# Patient Record
Sex: Female | Born: 1955 | Race: White | Hispanic: No | Marital: Married | Smoking: Never smoker
Health system: Southern US, Community
[De-identification: ages and names within clinical notes are randomized; demographics above are authoritative.]

## PROBLEM LIST (undated history)

## (undated) DIAGNOSIS — Z8489 Family history of other specified conditions: Secondary | ICD-10-CM

## (undated) DIAGNOSIS — G4700 Insomnia, unspecified: Secondary | ICD-10-CM

## (undated) DIAGNOSIS — K219 Gastro-esophageal reflux disease without esophagitis: Secondary | ICD-10-CM

## (undated) DIAGNOSIS — R112 Nausea with vomiting, unspecified: Secondary | ICD-10-CM

## (undated) DIAGNOSIS — N200 Calculus of kidney: Secondary | ICD-10-CM

## (undated) DIAGNOSIS — M8580 Other specified disorders of bone density and structure, unspecified site: Secondary | ICD-10-CM

## (undated) DIAGNOSIS — R002 Palpitations: Secondary | ICD-10-CM

## (undated) DIAGNOSIS — Z9889 Other specified postprocedural states: Secondary | ICD-10-CM

## (undated) DIAGNOSIS — M779 Enthesopathy, unspecified: Secondary | ICD-10-CM

## (undated) DIAGNOSIS — E785 Hyperlipidemia, unspecified: Secondary | ICD-10-CM

## (undated) DIAGNOSIS — M1990 Unspecified osteoarthritis, unspecified site: Secondary | ICD-10-CM

## (undated) DIAGNOSIS — N952 Postmenopausal atrophic vaginitis: Secondary | ICD-10-CM

## (undated) DIAGNOSIS — R519 Headache, unspecified: Secondary | ICD-10-CM

## (undated) DIAGNOSIS — R51 Headache: Secondary | ICD-10-CM

## (undated) DIAGNOSIS — Z9189 Other specified personal risk factors, not elsewhere classified: Secondary | ICD-10-CM

## (undated) DIAGNOSIS — Z91B Personal risk factor of exposure to diethylstilbestrol: Secondary | ICD-10-CM

## (undated) DIAGNOSIS — Z87442 Personal history of urinary calculi: Secondary | ICD-10-CM

## (undated) HISTORY — DX: Hyperlipidemia, unspecified: E78.5

## (undated) HISTORY — DX: Gastro-esophageal reflux disease without esophagitis: K21.9

## (undated) HISTORY — PX: ROTATOR CUFF REPAIR: SHX139

## (undated) HISTORY — DX: Calculus of kidney: N20.0

## (undated) HISTORY — DX: Headache, unspecified: R51.9

## (undated) HISTORY — DX: Other specified disorders of bone density and structure, unspecified site: M85.80

## (undated) HISTORY — PX: FOOT SURGERY: SHX648

## (undated) HISTORY — DX: Headache: R51

## (undated) HISTORY — DX: Enthesopathy, unspecified: M77.9

## (undated) HISTORY — DX: Other specified personal risk factors, not elsewhere classified: Z91.89

## (undated) HISTORY — PX: APPENDECTOMY: SHX54

## (undated) HISTORY — DX: Palpitations: R00.2

## (undated) HISTORY — PX: ADENOIDECTOMY: SUR15

## (undated) HISTORY — PX: TONSILLECTOMY: SUR1361

## (undated) HISTORY — DX: Insomnia, unspecified: G47.00

## (undated) HISTORY — PX: ARTHROSCOPIC REPAIR ACL: SUR80

## (undated) HISTORY — DX: Postmenopausal atrophic vaginitis: N95.2

## (undated) HISTORY — DX: Personal risk factor of exposure to diethylstilbestrol: Z91.B

---

## 2004-05-29 ENCOUNTER — Ambulatory Visit: Payer: Self-pay

## 2006-02-25 ENCOUNTER — Ambulatory Visit: Payer: Self-pay

## 2007-05-14 ENCOUNTER — Ambulatory Visit: Payer: Self-pay | Admitting: Internal Medicine

## 2007-05-26 ENCOUNTER — Ambulatory Visit: Payer: Self-pay | Admitting: Internal Medicine

## 2007-07-22 ENCOUNTER — Ambulatory Visit: Payer: Self-pay | Admitting: Gastroenterology

## 2007-08-19 ENCOUNTER — Encounter: Payer: Self-pay | Admitting: Physician Assistant

## 2007-09-07 ENCOUNTER — Encounter: Payer: Self-pay | Admitting: Physician Assistant

## 2008-06-01 ENCOUNTER — Ambulatory Visit: Payer: Self-pay | Admitting: Internal Medicine

## 2009-06-06 ENCOUNTER — Ambulatory Visit: Payer: Self-pay

## 2009-06-13 ENCOUNTER — Ambulatory Visit: Payer: Self-pay

## 2009-07-26 ENCOUNTER — Encounter: Payer: Self-pay | Admitting: Unknown Physician Specialty

## 2009-08-06 ENCOUNTER — Encounter: Payer: Self-pay | Admitting: Unknown Physician Specialty

## 2009-09-06 ENCOUNTER — Encounter: Payer: Self-pay | Admitting: Unknown Physician Specialty

## 2009-10-06 ENCOUNTER — Encounter: Payer: Self-pay | Admitting: Unknown Physician Specialty

## 2009-11-06 ENCOUNTER — Encounter: Payer: Self-pay | Admitting: Unknown Physician Specialty

## 2009-12-07 ENCOUNTER — Encounter: Payer: Self-pay | Admitting: Unknown Physician Specialty

## 2010-05-18 ENCOUNTER — Encounter: Payer: Self-pay | Admitting: Sports Medicine

## 2010-06-07 ENCOUNTER — Encounter: Payer: Self-pay | Admitting: Sports Medicine

## 2010-07-08 ENCOUNTER — Encounter: Payer: Self-pay | Admitting: Sports Medicine

## 2010-07-24 ENCOUNTER — Ambulatory Visit: Payer: Self-pay | Admitting: Unknown Physician Specialty

## 2010-08-07 ENCOUNTER — Encounter: Payer: Self-pay | Admitting: Sports Medicine

## 2010-08-27 ENCOUNTER — Ambulatory Visit: Payer: Self-pay | Admitting: Unknown Physician Specialty

## 2011-04-09 LAB — HM COLONOSCOPY

## 2011-07-11 ENCOUNTER — Ambulatory Visit: Payer: Self-pay | Admitting: Podiatry

## 2011-08-19 ENCOUNTER — Ambulatory Visit: Payer: Self-pay | Admitting: Internal Medicine

## 2012-03-19 ENCOUNTER — Ambulatory Visit: Payer: Self-pay | Admitting: Podiatry

## 2012-10-22 ENCOUNTER — Ambulatory Visit: Payer: Self-pay | Admitting: Podiatry

## 2013-01-21 ENCOUNTER — Ambulatory Visit: Payer: Self-pay | Admitting: Obstetrics and Gynecology

## 2013-06-27 ENCOUNTER — Emergency Department: Payer: Self-pay | Admitting: Emergency Medicine

## 2013-09-10 ENCOUNTER — Ambulatory Visit: Payer: Self-pay | Admitting: Internal Medicine

## 2014-02-22 DIAGNOSIS — M7542 Impingement syndrome of left shoulder: Secondary | ICD-10-CM | POA: Insufficient documentation

## 2014-02-22 DIAGNOSIS — M7540 Impingement syndrome of unspecified shoulder: Secondary | ICD-10-CM | POA: Insufficient documentation

## 2014-03-01 DIAGNOSIS — M542 Cervicalgia: Secondary | ICD-10-CM

## 2014-03-01 DIAGNOSIS — G8929 Other chronic pain: Secondary | ICD-10-CM | POA: Insufficient documentation

## 2014-03-01 DIAGNOSIS — M25519 Pain in unspecified shoulder: Secondary | ICD-10-CM | POA: Insufficient documentation

## 2014-03-02 ENCOUNTER — Ambulatory Visit: Payer: Self-pay | Admitting: Obstetrics and Gynecology

## 2014-03-04 ENCOUNTER — Ambulatory Visit: Payer: Self-pay | Admitting: Unknown Physician Specialty

## 2014-03-07 LAB — HM MAMMOGRAPHY

## 2014-03-25 ENCOUNTER — Ambulatory Visit: Payer: Self-pay | Admitting: Unknown Physician Specialty

## 2014-04-05 DIAGNOSIS — M75122 Complete rotator cuff tear or rupture of left shoulder, not specified as traumatic: Secondary | ICD-10-CM | POA: Insufficient documentation

## 2014-07-12 ENCOUNTER — Encounter: Payer: Self-pay | Admitting: *Deleted

## 2014-11-03 DIAGNOSIS — M25551 Pain in right hip: Secondary | ICD-10-CM | POA: Insufficient documentation

## 2014-11-03 DIAGNOSIS — M7070 Other bursitis of hip, unspecified hip: Secondary | ICD-10-CM | POA: Insufficient documentation

## 2015-01-05 DIAGNOSIS — M5416 Radiculopathy, lumbar region: Secondary | ICD-10-CM | POA: Insufficient documentation

## 2015-01-06 ENCOUNTER — Other Ambulatory Visit: Payer: Self-pay | Admitting: Unknown Physician Specialty

## 2015-01-06 DIAGNOSIS — M25551 Pain in right hip: Secondary | ICD-10-CM

## 2015-01-12 LAB — HM PAP SMEAR: HM Pap smear: NEGATIVE

## 2015-01-16 ENCOUNTER — Ambulatory Visit

## 2015-01-16 ENCOUNTER — Ambulatory Visit
Admission: RE | Admit: 2015-01-16 | Discharge: 2015-01-16 | Disposition: A | Discharge status: Home / Self Care | Source: Ambulatory Visit | Attending: Unknown Physician Specialty | Admitting: Unknown Physician Specialty

## 2015-01-16 DIAGNOSIS — M25551 Pain in right hip: Secondary | ICD-10-CM | POA: Diagnosis present

## 2015-01-16 DIAGNOSIS — M47816 Spondylosis without myelopathy or radiculopathy, lumbar region: Secondary | ICD-10-CM | POA: Diagnosis not present

## 2015-01-16 DIAGNOSIS — M4806 Spinal stenosis, lumbar region: Secondary | ICD-10-CM | POA: Diagnosis not present

## 2015-01-17 ENCOUNTER — Encounter: Payer: Self-pay | Admitting: Obstetrics and Gynecology

## 2015-06-09 ENCOUNTER — Other Ambulatory Visit: Payer: Self-pay | Admitting: Obstetrics and Gynecology

## 2015-06-09 DIAGNOSIS — Z1231 Encounter for screening mammogram for malignant neoplasm of breast: Secondary | ICD-10-CM

## 2015-06-13 ENCOUNTER — Ambulatory Visit
Admission: RE | Admit: 2015-06-13 | Discharge: 2015-06-13 | Disposition: A | Discharge status: Home / Self Care | Source: Ambulatory Visit | Attending: Obstetrics and Gynecology | Admitting: Obstetrics and Gynecology

## 2015-06-13 DIAGNOSIS — Z1231 Encounter for screening mammogram for malignant neoplasm of breast: Secondary | ICD-10-CM

## 2016-07-16 DIAGNOSIS — M1712 Unilateral primary osteoarthritis, left knee: Secondary | ICD-10-CM | POA: Insufficient documentation

## 2016-07-19 ENCOUNTER — Other Ambulatory Visit (HOSPITAL_COMMUNITY): Payer: Self-pay | Admitting: Obstetrics and Gynecology

## 2016-07-19 ENCOUNTER — Other Ambulatory Visit: Payer: Self-pay | Admitting: Obstetrics and Gynecology

## 2016-07-19 DIAGNOSIS — Z1231 Encounter for screening mammogram for malignant neoplasm of breast: Secondary | ICD-10-CM

## 2016-07-29 ENCOUNTER — Telehealth: Payer: Self-pay

## 2016-07-29 NOTE — Telephone Encounter (Signed)
Left message on pts voice mail to contact office to schedule colonoscopy.

## 2016-08-02 ENCOUNTER — Telehealth: Payer: Self-pay

## 2016-08-02 ENCOUNTER — Other Ambulatory Visit: Payer: Self-pay

## 2016-08-02 DIAGNOSIS — K219 Gastro-esophageal reflux disease without esophagitis: Secondary | ICD-10-CM | POA: Insufficient documentation

## 2016-08-02 DIAGNOSIS — E7800 Pure hypercholesterolemia, unspecified: Secondary | ICD-10-CM | POA: Insufficient documentation

## 2016-08-02 DIAGNOSIS — Z860100 Personal history of colon polyps, unspecified: Secondary | ICD-10-CM

## 2016-08-02 DIAGNOSIS — M8580 Other specified disorders of bone density and structure, unspecified site: Secondary | ICD-10-CM | POA: Insufficient documentation

## 2016-08-02 DIAGNOSIS — M159 Polyosteoarthritis, unspecified: Secondary | ICD-10-CM | POA: Insufficient documentation

## 2016-08-02 NOTE — Telephone Encounter (Signed)
Gastroenterology Pre-Procedure Review  Request Date: 6/12 Requesting Physician: Dr. Allen Norris  PATIENT REVIEW QUESTIONS: The patient responded to the following health history questions as indicated:    1. Are you having any GI issues? no 2. Do you have a personal history of Polyps? yes (removed) 3. Do you have a family history of Colon Cancer or Polyps? no 4. Diabetes Mellitus? no 5. Joint replacements in the past 12 months?no 6. Major health problems in the past 3 months?no 7. Any artificial heart valves, MVP, or defibrillator?no    MEDICATIONS & ALLERGIES:    Patient reports the following regarding taking any anticoagulation/antiplatelet therapy:   Plavix, Coumadin, Eliquis, Xarelto, Lovenox, Pradaxa, Brilinta, or Effient? no Aspirin? no  Patient confirms/reports the following medications:  Current Outpatient Prescriptions  Medication Sig Dispense Refill  . butalbital-aspirin-caffeine (FIORINAL) 50-325-40 MG capsule Take by mouth.    Marland Kitchen omeprazole (PRILOSEC) 10 MG capsule Take by mouth.    . raloxifene (EVISTA) 60 MG tablet Take by mouth.    . simvastatin (ZOCOR) 20 MG tablet Take by mouth.     No current facility-administered medications for this visit.     Patient confirms/reports the following allergies:  Allergies  Allergen Reactions  . Morphine Nausea Only  . Other Anaphylaxis    IVP contrast dye  . Penicillin G Anaphylaxis  . Codeine Nausea Only  . Shellfish Allergy Swelling    No orders of the defined types were placed in this encounter.   AUTHORIZATION INFORMATION Primary Insurance: 1D#: Group #:  Secondary Insurance: 1D#: Group #:  SCHEDULE INFORMATION: Date: 6/12 Time: Location: [facility]

## 2016-08-06 ENCOUNTER — Telehealth: Payer: Self-pay | Admitting: Gastroenterology

## 2016-08-06 NOTE — Telephone Encounter (Signed)
08/06/16 Faxed Prior Auth Form to [facility] [employer]

## 2016-08-07 ENCOUNTER — Telehealth: Payer: Self-pay | Admitting: Gastroenterology

## 2016-08-07 NOTE — Telephone Encounter (Signed)
08/08/16 Received fax from [facility]. 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

## 2016-08-20 ENCOUNTER — Ambulatory Visit
Admission: RE | Admit: 2016-08-20 | Discharge: 2016-08-20 | Disposition: A | Discharge status: Home / Self Care | Payer: TRICARE For Life (TFL) | Source: Ambulatory Visit | Attending: Obstetrics and Gynecology | Admitting: Obstetrics and Gynecology

## 2016-08-20 DIAGNOSIS — Z1231 Encounter for screening mammogram for malignant neoplasm of breast: Secondary | ICD-10-CM

## 2016-09-16 ENCOUNTER — Encounter: Payer: Self-pay | Admitting: *Deleted

## 2016-09-17 ENCOUNTER — Encounter
Admission: RE | Disposition: A | Discharge status: Home / Self Care | Payer: Self-pay | Source: Ambulatory Visit | Attending: Gastroenterology

## 2016-09-17 ENCOUNTER — Ambulatory Visit: Admitting: Certified Registered"

## 2016-09-17 ENCOUNTER — Ambulatory Visit
Admission: RE | Admit: 2016-09-17 | Discharge: 2016-09-17 | Disposition: A | Discharge status: Home / Self Care | Source: Ambulatory Visit | Attending: Gastroenterology | Admitting: Gastroenterology

## 2016-09-17 DIAGNOSIS — K219 Gastro-esophageal reflux disease without esophagitis: Secondary | ICD-10-CM | POA: Insufficient documentation

## 2016-09-17 DIAGNOSIS — Z860100 Personal history of colon polyps, unspecified: Secondary | ICD-10-CM

## 2016-09-17 DIAGNOSIS — E785 Hyperlipidemia, unspecified: Secondary | ICD-10-CM | POA: Diagnosis not present

## 2016-09-17 DIAGNOSIS — Z6832 Body mass index (BMI) 32.0-32.9, adult: Secondary | ICD-10-CM | POA: Diagnosis not present

## 2016-09-17 DIAGNOSIS — D123 Benign neoplasm of transverse colon: Secondary | ICD-10-CM

## 2016-09-17 DIAGNOSIS — K640 First degree hemorrhoids: Secondary | ICD-10-CM | POA: Insufficient documentation

## 2016-09-17 DIAGNOSIS — D124 Benign neoplasm of descending colon: Secondary | ICD-10-CM | POA: Insufficient documentation

## 2016-09-17 DIAGNOSIS — Z8249 Family history of ischemic heart disease and other diseases of the circulatory system: Secondary | ICD-10-CM | POA: Insufficient documentation

## 2016-09-17 DIAGNOSIS — Z833 Family history of diabetes mellitus: Secondary | ICD-10-CM | POA: Diagnosis not present

## 2016-09-17 DIAGNOSIS — Z91013 Allergy to seafood: Secondary | ICD-10-CM | POA: Insufficient documentation

## 2016-09-17 DIAGNOSIS — D125 Benign neoplasm of sigmoid colon: Secondary | ICD-10-CM

## 2016-09-17 DIAGNOSIS — Z1211 Encounter for screening for malignant neoplasm of colon: Secondary | ICD-10-CM | POA: Diagnosis present

## 2016-09-17 DIAGNOSIS — D122 Benign neoplasm of ascending colon: Secondary | ICD-10-CM

## 2016-09-17 DIAGNOSIS — D120 Benign neoplasm of cecum: Secondary | ICD-10-CM | POA: Diagnosis not present

## 2016-09-17 DIAGNOSIS — R002 Palpitations: Secondary | ICD-10-CM | POA: Insufficient documentation

## 2016-09-17 DIAGNOSIS — M8580 Other specified disorders of bone density and structure, unspecified site: Secondary | ICD-10-CM | POA: Diagnosis not present

## 2016-09-17 DIAGNOSIS — Z79899 Other long term (current) drug therapy: Secondary | ICD-10-CM | POA: Diagnosis not present

## 2016-09-17 DIAGNOSIS — Z880 Allergy status to penicillin: Secondary | ICD-10-CM | POA: Diagnosis not present

## 2016-09-17 DIAGNOSIS — K635 Polyp of colon: Secondary | ICD-10-CM

## 2016-09-17 DIAGNOSIS — G4700 Insomnia, unspecified: Secondary | ICD-10-CM | POA: Diagnosis not present

## 2016-09-17 DIAGNOSIS — Z888 Allergy status to other drugs, medicaments and biological substances status: Secondary | ICD-10-CM | POA: Diagnosis not present

## 2016-09-17 DIAGNOSIS — Z87442 Personal history of urinary calculi: Secondary | ICD-10-CM | POA: Insufficient documentation

## 2016-09-17 DIAGNOSIS — E669 Obesity, unspecified: Secondary | ICD-10-CM | POA: Insufficient documentation

## 2016-09-17 DIAGNOSIS — Z885 Allergy status to narcotic agent status: Secondary | ICD-10-CM | POA: Diagnosis not present

## 2016-09-17 HISTORY — PX: COLONOSCOPY WITH PROPOFOL: SHX5780

## 2016-09-17 SURGERY — COLONOSCOPY WITH PROPOFOL
Anesthesia: General

## 2016-09-17 MED ORDER — SODIUM CHLORIDE 0.9 % IV SOLN
INTRAVENOUS | Status: DC
  Administered 2016-09-17: 1000 mL via INTRAVENOUS

## 2016-09-17 MED ORDER — PROPOFOL 500 MG/50ML IV EMUL
INTRAVENOUS | Status: DC | PRN
  Administered 2016-09-17: 120 ug/kg/min via INTRAVENOUS

## 2016-09-17 MED ORDER — PROPOFOL 500 MG/50ML IV EMUL
INTRAVENOUS | Status: AC
  Filled 2016-09-17: qty 50

## 2016-09-17 MED ORDER — LIDOCAINE 2% (20 MG/ML) 5 ML SYRINGE
INTRAMUSCULAR | Status: DC | PRN
  Administered 2016-09-17: 25 mg via INTRAVENOUS

## 2016-09-17 MED ORDER — MIDAZOLAM HCL 5 MG/5ML IJ SOLN
INTRAMUSCULAR | Status: DC | PRN
  Administered 2016-09-17: 2 mg via INTRAVENOUS

## 2016-09-17 MED ORDER — MIDAZOLAM HCL 2 MG/2ML IJ SOLN
INTRAMUSCULAR | Status: AC
  Filled 2016-09-17: qty 2

## 2016-09-17 MED ORDER — PROPOFOL 10 MG/ML IV BOLUS
INTRAVENOUS | Status: DC | PRN
  Administered 2016-09-17 (×2): 50 mg via INTRAVENOUS

## 2016-09-17 NOTE — Anesthesia Postprocedure Evaluation (Signed)
Anesthesia Post Note  Patient: Cindy Carpenter  Procedure(s) Performed: Procedure(s) (LRB): COLONOSCOPY WITH PROPOFOL (N/A)  Patient location during evaluation: PACU Anesthesia Type: General Level of consciousness: awake Pain management: pain level controlled Vital Signs Assessment: post-procedure vital signs reviewed and stable Respiratory status: nonlabored ventilation Cardiovascular status: stable Anesthetic complications: no     Last Vitals:  Vitals:   09/17/16 1032 09/17/16 1036  BP: 129/80 135/87  Pulse: 76 71  Resp: 15 18  Temp:      Last Pain:  Vitals:   09/17/16 0904  TempSrc: Tympanic                 VAN STAVEREN,Sharifa Bucholz

## 2016-09-17 NOTE — H&P (Signed)
Cindy Lame, MD [address] [place], [place] [address] Phone:[phone] Fax : [phone]  Primary Care Physician:  Idelle Crouch, MD Primary Gastroenterologist:  Dr. Allen Norris  Pre-Procedure History & Physical: HPI:  Cindy Carpenter is a 61 y.o. female is here for an colonoscopy.   Past Medical History:  Diagnosis Date  . Bone spur   . DES exposure in utero   . GERD (gastroesophageal reflux disease)   . Headache    migraine  . Heart palpitations   . Hyperlipemia   . Insomnia   . Kidney stones   . Osteopenia   . Vaginal atrophy     Past Surgical History:  Procedure Laterality Date  . ADENOIDECTOMY    . APPENDECTOMY    . ARTHROSCOPIC REPAIR ACL    . FOOT SURGERY    . TONSILLECTOMY      Prior to Admission medications   Medication Sig Start Date End Date Taking? Authorizing Provider  butalbital-aspirin-caffeine [facility]) [phone] MG capsule Take by mouth. 01/05/15  Yes [provider]  Calcium Carb-Cholecalciferol (CALCIUM-VITAMIN D) 500-200 MG-UNIT tablet Take by mouth.   Yes [provider]  Cholecalciferol (VITAMIN D-1000 MAX ST) 1000 units tablet Take by mouth.   Yes [provider]  estradiol (ESTRACE) 0.1 MG/GM vaginal cream Place vaginally. 06/08/15  Yes [provider]  Multiple Vitamin (MULTI-VITAMINS) TABS Take by mouth.   Yes [provider]  omeprazole (PRILOSEC) 10 MG capsule Take by mouth.   Yes [provider]  raloxifene (EVISTA) 60 MG tablet Take by mouth. 02/04/14  Yes [provider]  traZODone (DESYREL) 50 MG tablet  03/04/14  Yes [provider]  simvastatin (ZOCOR) 20 MG tablet Take by mouth. 02/04/14   [provider]    Allergies as of 08/02/2016 - never reviewed  Allergen Reaction Noted  . Morphine Nausea Only 01/16/2015  . Other Anaphylaxis 01/16/2015  . Penicillin g Anaphylaxis 01/16/2015  . Codeine Nausea Only 01/16/2015  . Shellfish  allergy Swelling 01/16/2015    Family History  Problem Relation Age of Onset  . Heart disease Mother   . Diabetes Mother   . Heart disease Father   . Diabetes Father   . Hypertension Father   . Diabetes Brother   . Ovarian cancer Maternal Grandmother   . Breast cancer Neg Hx   . Colon cancer Neg Hx     Social History   Social History  . Marital status: Married    Spouse name: N/A  . Number of children: N/A  . Years of education: N/A   Occupational History  . Not on file.   Social History Main Topics  . Smoking status: Never Smoker  . Smokeless tobacco: Not on file  . Alcohol use Yes     Comment: occas  . Drug use: No  . Sexual activity: Yes    Birth control/ protection: Post-menopausal   Other Topics Concern  . Not on file   Social History Narrative  . No narrative on file    Review of Systems: See HPI, otherwise negative ROS  Physical Exam: BP (!) 145/81   Pulse 82   Temp 98.5 F (36.9 C) (Tympanic)   Resp 16   Ht 5\' 2"  (1.575 m)   Wt 178 lb (80.7 kg)   SpO2 100%   BMI 32.56 kg/m  General:   Alert,  pleasant and cooperative in NAD Head:  Normocephalic and atraumatic. Neck:  Supple; no masses or  thyromegaly. Lungs:  Clear throughout to auscultation.    Heart:  Regular rate and rhythm. Abdomen:  Soft, nontender and nondistended. Normal bowel sounds, without guarding, and without rebound.   Neurologic:  Alert and  oriented x4;  grossly normal neurologically.  Impression/Plan: [name] is here for an colonoscopy to be performed for history of polyps  Risks, benefits, limitations, and alternatives regarding  colonoscopy have been reviewed with the patient.  Questions have been answered.  All parties agreeable.   Cindy Lame, MD  09/17/2016, 9:36 AM

## 2016-09-17 NOTE — Op Note (Signed)
[facility] Gastroenterology Patient Name: Cindy Carpenter Procedure Date: 09/17/2016 9:37 AM MRN: [id number] Account #: [id number] Date of Birth: 08-17-1955 Admit Type: Outpatient Age: 61 Room: [facility] ENDO ROOM 4 Gender: Female Note Status: Finalized Procedure:            Colonoscopy Indications:          High risk colon cancer surveillance: Personal history                        of colonic polyps Providers:            Lucilla Lame MD, MD Referring MD:         Leonie Douglas. Doy Hutching, MD (Referring MD) Medicines:            Propofol per Anesthesia Complications:        No immediate complications. Procedure:            Pre-Anesthesia Assessment:                       - Prior to the procedure, a History and Physical was                        performed, and patient medications and allergies were                        reviewed. The patient's tolerance of previous                        anesthesia was also reviewed. The risks and benefits of                        the procedure and the sedation options and risks were                        discussed with the patient. All questions were                        answered, and informed consent was obtained. Prior                        Anticoagulants: The patient has taken no previous                        anticoagulant or antiplatelet agents. ASA Grade                        Assessment: II - A patient with mild systemic disease.                        After reviewing the risks and benefits, the patient was                        deemed in satisfactory condition to undergo the                        procedure.                       After obtaining informed consent, the colonoscope was  passed under direct vision. Throughout the procedure,                        the patient's blood pressure, pulse, and oxygen                        saturations were monitored continuously. The   Colonoscope was introduced through the anus and                        advanced to the the cecum, identified by appendiceal                        orifice and ileocecal valve. The colonoscopy was                        performed without difficulty. The patient tolerated the                        procedure well. The quality of the bowel preparation                        was excellent. Findings:      The perianal and digital rectal examinations were normal.      A 2 mm polyp was found in the cecum. The polyp was sessile. The polyp       was removed with a cold biopsy forceps. Resection and retrieval were       complete.      A 3 mm polyp was found in the ascending colon. The polyp was sessile.       The polyp was removed with a cold biopsy forceps. Resection and       retrieval were complete.      Three sessile polyps were found in the transverse colon. The polyps were       3 to 4 mm in size. These polyps were removed with a cold biopsy forceps.       Resection and retrieval were complete.      Two sessile polyps were found in the descending colon. The polyps were 3       to 4 mm in size. These polyps were removed with a cold biopsy forceps.       Resection and retrieval were complete.      A 3 mm polyp was found in the sigmoid colon. The polyp was sessile. The       polyp was removed with a cold biopsy forceps. Resection and retrieval       were complete.      Non-bleeding internal hemorrhoids were found during retroflexion. The       hemorrhoids were Grade I (internal hemorrhoids that do not prolapse). Impression:           - One 2 mm polyp in the cecum, removed with a cold                        biopsy forceps. Resected and retrieved.                       - One 3 mm polyp in the ascending colon, removed with a  cold biopsy forceps. Resected and retrieved.                       - Three 3 to 4 mm polyps in the transverse colon,                        removed  with a cold biopsy forceps. Resected and                        retrieved.                       - Two 3 to 4 mm polyps in the descending colon, removed                        with a cold biopsy forceps. Resected and retrieved.                       - One 3 mm polyp in the sigmoid colon, removed with a                        cold biopsy forceps. Resected and retrieved.                       - Non-bleeding internal hemorrhoids. Recommendation:       - Discharge patient to home.                       - Resume previous diet.                       - Continue present medications.                       - Await pathology results.                       - Repeat colonoscopy in 3 years for surveillance. Procedure Code(s):    --- Professional ---                       [phone], Colonoscopy, flexible; with biopsy, single or                        multiple Diagnosis Code(s):    --- Professional ---                       Z86.010, Personal history of colonic polyps                       D12.0, Benign neoplasm of cecum                       D12.2, Benign neoplasm of ascending colon                       D12.5, Benign neoplasm of sigmoid colon                       D12.3, Benign neoplasm of transverse colon (hepatic  flexure or splenic flexure)                       D12.4, Benign neoplasm of descending colon CPT copyright 2016 American Medical Association. All rights reserved. The codes documented in this report are preliminary and upon coder review may  be revised to meet current compliance requirements. Lucilla Lame MD, MD 09/17/2016 10:00:49 AM This report has been signed electronically. Number of Addenda: 0 Note Initiated On: 09/17/2016 9:37 AM Scope Withdrawal Time: 0 hours 8 minutes 7 seconds  Total Procedure Duration: 0 hours 14 minutes 10 seconds       [facility]

## 2016-09-17 NOTE — Anesthesia Preprocedure Evaluation (Signed)
Anesthesia Evaluation  Patient identified by MRN, date of birth, ID band Patient awake    Reviewed: Allergy & Precautions, NPO status , Patient's Chart, lab work & pertinent test results  Airway Mallampati: II       Dental  (+) Teeth Intact   Pulmonary neg pulmonary ROS,    Pulmonary exam normal        Cardiovascular Exercise Tolerance: Good  Rhythm:Regular     Neuro/Psych  Headaches,    GI/Hepatic Neg liver ROS, GERD  Medicated,  Endo/Other  negative endocrine ROS  Renal/GU      Musculoskeletal   Abdominal (+) + obese,   Peds negative pediatric ROS (+)  Hematology negative hematology ROS (+)   Anesthesia Other Findings   Reproductive/Obstetrics                             Anesthesia Physical Anesthesia Plan  ASA: II  Anesthesia Plan: General   Post-op Pain Management:    Induction: Intravenous  PONV Risk Score and Plan: 1 and Ondansetron  Airway Management Planned: Natural Airway  Additional Equipment:   Intra-op Plan:   Post-operative Plan:   Informed Consent: I have reviewed the patients History and Physical, chart, labs and discussed the procedure including the risks, benefits and alternatives for the proposed anesthesia with the patient or authorized representative who has indicated his/her understanding and acceptance.     Plan Discussed with: CRNA  Anesthesia Plan Comments:         Anesthesia Quick Evaluation

## 2016-09-17 NOTE — Anesthesia Post-op Follow-up Note (Cosign Needed)
Anesthesia QCDR form completed.        

## 2016-09-17 NOTE — Transfer of Care (Signed)
Immediate Anesthesia Transfer of Care Note  Patient: Cindy Carpenter  Procedure(s) Performed: Procedure(s): COLONOSCOPY WITH PROPOFOL (N/A)  Patient Location: Endoscopy Unit  Anesthesia Type:General  Level of Consciousness: awake and alert   Airway & Oxygen Therapy: Patient Spontanous Breathing and Patient connected to nasal cannula oxygen  Post-op Assessment: Report given to RN and Post -op Vital signs reviewed and stable  Post vital signs: Reviewed  Last Vitals:  Vitals:   09/17/16 0904 09/17/16 1004  BP: (!) 145/81 111/72  Pulse: 82 73  Resp: 16 10  Temp: 36.9 C (!) 36.1 C    Last Pain:  Vitals:   09/17/16 0904  TempSrc: Tympanic         Complications: No apparent anesthesia complications

## 2016-09-18 ENCOUNTER — Encounter: Payer: Self-pay | Admitting: Gastroenterology

## 2016-09-18 LAB — SURGICAL PATHOLOGY

## 2016-09-19 ENCOUNTER — Encounter: Payer: Self-pay | Admitting: Gastroenterology

## 2016-10-22 DIAGNOSIS — E669 Obesity, unspecified: Secondary | ICD-10-CM | POA: Insufficient documentation

## 2016-10-22 DIAGNOSIS — M65331 Trigger finger, right middle finger: Secondary | ICD-10-CM | POA: Insufficient documentation

## 2017-07-16 ENCOUNTER — Other Ambulatory Visit: Payer: Self-pay

## 2017-07-16 ENCOUNTER — Encounter: Payer: Self-pay | Admitting: *Deleted

## 2017-07-16 NOTE — Discharge Instructions (Signed)
INSTRUCTIONS FOLLOWING OCULOPLASTIC SURGERY °AMY M. FOWLER, MD ° °AFTER YOUR EYE SURGERY, THER ARE MANY THINGS THWIHC YOU, THE PATIENT, CAN DO TO ASSURE THE BEST POSSIBLE RESULT FROM YOUR OPERATION.  THIS SHEET SHOULD BE REFERRED TO WHENEVER QUESTIONS ARISE.  IF THERE ARE ANY QUESTIONS NOT ANSWERED HERE, DO NOT HESITATE TO CALL OUR OFFICE AT [phone] OR [phone].  THERE IS ALWAYS [facility] AVAILABLE TO CALL IF QUESTIONS OR PROBLEMS ARISE. ° °VISION: Your vision may be blurred and out of focus after surgery until you are able to stop using your ointment, swelling resolves and your eye(s) heal. This may take 1 to 2 weeks at the least.  If your vision becomes gradually more dim or dark, this is not normal and you need to call our office immediately. ° °EYE CARE: For the first 48 hours after surgery, use ice packs frequently - “20 minutes on, 20 minutes off” - to help reduce swelling and bruising.  Small bags of frozen peas or corn make good ice packs along with cloths soaked in ice water.  If you are wearing a patch or other type of dressing following surgery, keep this on for the amount of time specified by your doctor.  For the first week following surgery, you will need to treat your stitches with great care.  If is OK to shower, but take care to not allow soapy water to run into your eye(s) to help reduce changes of infection.  You may gently clean the eyelashes and around the eye(s) with cotton balls and sterile water, BUT DO NOT RUB THE STITCHES VIGOROUSLY.  Keeping your stitches moist with ointment will help promote healing with minimal scar formation. ° °ACTIVITY: When you leave the surgery center, you should go home, rest and be inactive.  The eye(s) may feel scratchy and keeping the eyes closed will allow for faster healing.  The first week following surgery, avoid straining (anything making the face turn red) or lifting over 20 pounds.  Additionally, avoid bending which causes your head to go below  your waist.  Using your eyes will NOT harm them, so feel free to read, watch television, use the computer, etc as desired.  Driving depends on each individual, so check with your doctor if you have questions about driving. ° °MEDICATIONS:  You will be given a prescription for an ointment to use 4 times a day on your stitches.  You can use the ointment in your eyes if they feel scratchy or irritated.  If you eyelid(s) don’t close completely when you sleep, put some ointment in your eyes before bedtime. ° °EMERGENCY: If you experience SEVERE EYE PAIN OR HEADACHE UNRELIEVED BY TYLENOL OR PERCOCET, NAUSEA OR VOMITING, WORSENING REDNESS, OR WORSENING VISION (ESPECIALLY VISION THAT WA INITIALLY BETTER) CALL [phone] OR [phone] DURING BUSINESS HOURS OR AFTER HOURS. ° °General Anesthesia, Adult, Care After °These instructions provide you with information about caring for yourself after your procedure. Your health care provider may also give you more specific instructions. Your treatment has been planned according to current medical practices, but problems sometimes occur. Call your health care provider if you have any problems or questions after your procedure. °What can I expect after the procedure? °After the procedure, it is common to have: °· Vomiting. °· A sore throat. °· Mental slowness. ° °It is common to feel: °· Nauseous. °· Cold or shivery. °· Sleepy. °· Tired. °· Sore or achy, even in parts of your body where you did not have surgery. ° °  Follow these instructions at home: °For at least 24 hours after the procedure: °· Do not: °? Participate in activities where you could fall or become injured. °? Drive. °? Use heavy machinery. °? Drink alcohol. °? Take sleeping pills or medicines that cause drowsiness. °? Make important decisions or sign legal documents. °? Take care of children on your own. °· Rest. °Eating and drinking °· If you vomit, drink water, juice, or soup when you can drink without  vomiting. °· Drink enough fluid to keep your urine clear or pale yellow. °· Make sure you have little or no nausea before eating solid foods. °· Follow the diet recommended by your health care provider. °General instructions °· Have a responsible adult stay with you until you are awake and alert. °· Return to your normal activities as told by your health care provider. Ask your health care provider what activities are safe for you. °· Take over-the-counter and prescription medicines only as told by your health care provider. °· If you smoke, do not smoke without supervision. °· Keep all follow-up visits as told by your health care provider. This is important. °Contact a health care provider if: °· You continue to have nausea or vomiting at home, and medicines are not helpful. °· You cannot drink fluids or start eating again. °· You cannot urinate after 8-12 hours. °· You develop a skin rash. °· You have fever. °· You have increasing redness at the site of your procedure. °Get help right away if: °· You have difficulty breathing. °· You have chest pain. °· You have unexpected bleeding. °· You feel that you are having a life-threatening or urgent problem. °This information is not intended to replace advice given to you by your health care provider. Make sure you discuss any questions you have with your health care provider. °Document Released: 07/01/2000 Document Revised: 08/28/2015 Document Reviewed: 03/09/2015 °Elsevier Interactive Patient Education © 2018 [employer] Inc[employer] ° °

## 2017-07-22 ENCOUNTER — Ambulatory Visit
Admission: RE | Admit: 2017-07-22 | Discharge: 2017-07-22 | Disposition: A | Discharge status: Home / Self Care | Source: Ambulatory Visit | Attending: Ophthalmology | Admitting: Ophthalmology

## 2017-07-22 ENCOUNTER — Ambulatory Visit: Admitting: Anesthesiology

## 2017-07-22 ENCOUNTER — Encounter
Admission: RE | Disposition: A | Discharge status: Home / Self Care | Payer: Self-pay | Source: Ambulatory Visit | Attending: Ophthalmology

## 2017-07-22 DIAGNOSIS — Z87442 Personal history of urinary calculi: Secondary | ICD-10-CM | POA: Diagnosis not present

## 2017-07-22 DIAGNOSIS — Z91041 Radiographic dye allergy status: Secondary | ICD-10-CM | POA: Insufficient documentation

## 2017-07-22 DIAGNOSIS — Z885 Allergy status to narcotic agent status: Secondary | ICD-10-CM | POA: Diagnosis not present

## 2017-07-22 DIAGNOSIS — Z79899 Other long term (current) drug therapy: Secondary | ICD-10-CM | POA: Insufficient documentation

## 2017-07-22 DIAGNOSIS — H02834 Dermatochalasis of left upper eyelid: Secondary | ICD-10-CM | POA: Diagnosis present

## 2017-07-22 DIAGNOSIS — Z880 Allergy status to penicillin: Secondary | ICD-10-CM | POA: Diagnosis not present

## 2017-07-22 DIAGNOSIS — M8580 Other specified disorders of bone density and structure, unspecified site: Secondary | ICD-10-CM | POA: Diagnosis not present

## 2017-07-22 DIAGNOSIS — F329 Major depressive disorder, single episode, unspecified: Secondary | ICD-10-CM | POA: Insufficient documentation

## 2017-07-22 DIAGNOSIS — Z91013 Allergy to seafood: Secondary | ICD-10-CM | POA: Insufficient documentation

## 2017-07-22 DIAGNOSIS — K219 Gastro-esophageal reflux disease without esophagitis: Secondary | ICD-10-CM | POA: Insufficient documentation

## 2017-07-22 DIAGNOSIS — M1990 Unspecified osteoarthritis, unspecified site: Secondary | ICD-10-CM | POA: Diagnosis not present

## 2017-07-22 DIAGNOSIS — H02831 Dermatochalasis of right upper eyelid: Secondary | ICD-10-CM | POA: Insufficient documentation

## 2017-07-22 DIAGNOSIS — E7800 Pure hypercholesterolemia, unspecified: Secondary | ICD-10-CM | POA: Insufficient documentation

## 2017-07-22 HISTORY — PX: BROW LIFT: SHX178

## 2017-07-22 HISTORY — DX: Unspecified osteoarthritis, unspecified site: M19.90

## 2017-07-22 HISTORY — DX: Nausea with vomiting, unspecified: R11.2

## 2017-07-22 HISTORY — DX: Other specified postprocedural states: Z98.890

## 2017-07-22 HISTORY — DX: Family history of other specified conditions: Z84.89

## 2017-07-22 SURGERY — BLEPHAROPLASTY
Anesthesia: Monitor Anesthesia Care | Site: Eye | Laterality: Bilateral | Wound class: Clean

## 2017-07-22 MED ORDER — MIDAZOLAM HCL 2 MG/2ML IJ SOLN
INTRAMUSCULAR | Status: DC | PRN
  Administered 2017-07-22: 2 mg via INTRAVENOUS

## 2017-07-22 MED ORDER — OXYCODONE HCL 5 MG PO TABS: 5.0000 mg | ORAL_TABLET | Freq: Once | ORAL | Status: DC | PRN

## 2017-07-22 MED ORDER — PROMETHAZINE HCL 25 MG/ML IJ SOLN: 6.2500 mg | INTRAMUSCULAR | Status: DC | PRN

## 2017-07-22 MED ORDER — LIDOCAINE HCL (CARDIAC) 20 MG/ML IV SOLN
INTRAVENOUS | Status: DC | PRN
  Administered 2017-07-22: 40 mg via INTRAVENOUS

## 2017-07-22 MED ORDER — ALFENTANIL 500 MCG/ML IJ INJ
INJECTION | INTRAVENOUS | Status: DC | PRN
  Administered 2017-07-22: 300 ug via INTRAVENOUS
  Administered 2017-07-22: 700 ug via INTRAVENOUS

## 2017-07-22 MED ORDER — ERYTHROMYCIN 5 MG/GM OP OINT
TOPICAL_OINTMENT | OPHTHALMIC | Status: DC | PRN
  Administered 2017-07-22: 1 via OPHTHALMIC

## 2017-07-22 MED ORDER — FENTANYL CITRATE (PF) 100 MCG/2ML IJ SOLN: 25.0000 ug | INTRAMUSCULAR | Status: DC | PRN

## 2017-07-22 MED ORDER — TETRACAINE HCL 0.5 % OP SOLN
OPHTHALMIC | Status: DC | PRN
  Administered 2017-07-22: 2 [drp] via OPHTHALMIC

## 2017-07-22 MED ORDER — LACTATED RINGERS IV SOLN
10.0000 mL/h | INTRAVENOUS | Status: DC
  Administered 2017-07-22: 10 mL/h via INTRAVENOUS

## 2017-07-22 MED ORDER — OXYCODONE HCL 5 MG/5ML PO SOLN: 5.0000 mg | Freq: Once | ORAL | Status: DC | PRN

## 2017-07-22 MED ORDER — ONDANSETRON HCL 4 MG/2ML IJ SOLN
INTRAMUSCULAR | Status: DC | PRN
  Administered 2017-07-22: 4 mg via INTRAVENOUS

## 2017-07-22 MED ORDER — LIDOCAINE-EPINEPHRINE 2 %-1:100000 IJ SOLN
INTRAMUSCULAR | Status: DC | PRN
  Administered 2017-07-22: 2 mL via OPHTHALMIC

## 2017-07-22 MED ORDER — ACETAMINOPHEN 325 MG PO TABS: 325.0000 mg | ORAL_TABLET | ORAL | Status: DC | PRN

## 2017-07-22 MED ORDER — PROPOFOL 500 MG/50ML IV EMUL
INTRAVENOUS | Status: DC | PRN
  Administered 2017-07-22: 75 ug/kg/min via INTRAVENOUS

## 2017-07-22 MED ORDER — ACETAMINOPHEN 160 MG/5ML PO SOLN: 325.0000 mg | ORAL | Status: DC | PRN

## 2017-07-22 MED ORDER — ERYTHROMYCIN 5 MG/GM OP OINT: TOPICAL_OINTMENT | OPHTHALMIC | 3 refills | Status: DC

## 2017-07-22 MED ORDER — BSS IO SOLN
INTRAOCULAR | Status: DC | PRN
  Administered 2017-07-22: 15 mL

## 2017-07-22 MED ORDER — TRAMADOL HCL 50 MG PO TABS: ORAL_TABLET | ORAL | 0 refills | Status: DC

## 2017-07-22 SURGICAL SUPPLY — 35 items
APPLICATOR COTTON TIP WD 3 STR (MISCELLANEOUS) ×4 IMPLANT
BLADE SURG 15 STRL LF DISP TIS (BLADE) ×1 IMPLANT
BLADE SURG 15 STRL SS (BLADE) ×1
CORD BIP STRL DISP 12FT (MISCELLANEOUS) ×2 IMPLANT
DRAPE HEAD BAR (DRAPES) ×2 IMPLANT
GAUZE SPONGE 4X4 12PLY STRL (GAUZE/BANDAGES/DRESSINGS) ×2 IMPLANT
GAUZE SPONGE NON-WVN 2X2 STRL (MISCELLANEOUS) ×10 IMPLANT
GLOVE SURG LX 7.0 MICRO (GLOVE) ×2
GLOVE SURG LX STRL 7.0 MICRO (GLOVE) ×2 IMPLANT
MARKER SKIN XFINE TIP W/RULER (MISCELLANEOUS) ×2 IMPLANT
NEEDLE FILTER BLUNT 18X 1/2SAF (NEEDLE) ×1
NEEDLE FILTER BLUNT 18X1 1/2 (NEEDLE) ×1 IMPLANT
NEEDLE HYPO 30X.5 LL (NEEDLE) ×4 IMPLANT
PACK DRAPE NASAL/ENT (PACKS) ×2 IMPLANT
SOL PREP PVP 2OZ (MISCELLANEOUS) ×2
SOLUTION PREP PVP 2OZ (MISCELLANEOUS) ×1 IMPLANT
SPONGE VERSALON 2X2 STRL (MISCELLANEOUS) ×10
SUT CHROMIC 4-0 (SUTURE)
SUT CHROMIC 4-0 M2 12X2 ARM (SUTURE)
SUT CHROMIC 5 0 P 3 (SUTURE) IMPLANT
SUT ETHILON 4 0 CL P 3 (SUTURE) IMPLANT
SUT MERSILENE 4-0 S-2 (SUTURE) IMPLANT
SUT PLAIN GUT (SUTURE) ×2 IMPLANT
SUT PROLENE 5 0 P 3 (SUTURE) IMPLANT
SUT PROLENE 6 0 P 1 18 (SUTURE) IMPLANT
SUT SILK 4 0 G 3 (SUTURE) IMPLANT
SUT VIC AB 5-0 P-3 18X BRD (SUTURE) IMPLANT
SUT VIC AB 5-0 P3 18 (SUTURE)
SUT VICRYL 6-0  S14 CTD (SUTURE)
SUT VICRYL 6-0 S14 CTD (SUTURE) IMPLANT
SUT VICRYL 7 0 TG140 8 (SUTURE) IMPLANT
SUTURE CHRMC 4-0 M2 12X2 ARM (SUTURE) IMPLANT
SYR 3ML LL SCALE MARK (SYRINGE) ×2 IMPLANT
SYRINGE 10CC LL (SYRINGE) ×2 IMPLANT
WATER STERILE IRR 250ML POUR (IV SOLUTION) ×2 IMPLANT

## 2017-07-22 NOTE — Transfer of Care (Signed)
Immediate Anesthesia Transfer of Care Note  Patient: Cindy Carpenter  Procedure(s) Performed: BLEPHAROPLASTY UPPER EYELID WITH EXCESS SKIN (Bilateral Eye)  Patient Location: PACU  Anesthesia Type: MAC  Level of Consciousness: awake, alert  and patient cooperative  Airway and Oxygen Therapy: Patient Spontanous Breathing and Patient connected to supplemental oxygen  Post-op Assessment: Post-op Vital signs reviewed, Patient's Cardiovascular Status Stable, Respiratory Function Stable, Patent Airway and No signs of Nausea or vomiting  Post-op Vital Signs: Reviewed and stable  Complications: No apparent anesthesia complications

## 2017-07-22 NOTE — Anesthesia Preprocedure Evaluation (Addendum)
Anesthesia Evaluation  Patient identified by MRN, date of birth, ID band Patient awake    Reviewed: Allergy & Precautions, NPO status , Patient's Chart, lab work & pertinent test results  History of Anesthesia Complications (+) PONV  Airway Mallampati: II  TM Distance: >3 FB Neck ROM: Full    Dental  (+) Teeth Intact   Pulmonary    breath sounds clear to auscultation       Cardiovascular Exercise Tolerance: Good  Rhythm:Regular Rate:Normal  hyperlipidemia   Neuro/Psych  Headaches,    GI/Hepatic   Endo/Other    Renal/GU Renal disease (stones)     Musculoskeletal  (+) Arthritis ,   Abdominal   Peds  Hematology   Anesthesia Other Findings   Reproductive/Obstetrics                            Anesthesia Physical Anesthesia Plan  ASA: II  Anesthesia Plan: MAC   Post-op Pain Management:    Induction: Intravenous  PONV Risk Score and Plan: Propofol infusion and Midazolam  Airway Management Planned: Nasal Cannula  Additional Equipment:   Intra-op Plan:   Post-operative Plan:   Informed Consent: I have reviewed the patients History and Physical, chart, labs and discussed the procedure including the risks, benefits and alternatives for the proposed anesthesia with the patient or authorized representative who has indicated his/her understanding and acceptance.     Plan Discussed with: CRNA  Anesthesia Plan Comments:         Anesthesia Quick Evaluation

## 2017-07-22 NOTE — Interval H&P Note (Signed)
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 After consideration of risks, benefits and other options for treatment, the patient has consented to  Procedure(s): BLEPHAROPLASTY UPPER EYELID WITH EXCESS SKIN (Bilateral) as a surgical intervention .  The patient's history has been reviewed, patient examined, no change in status, stable for surgery.  I have reviewed the patient's chart and labs.  Questions were answered to the patient's satisfaction.     Vickki Muff, Benney [place] M

## 2017-07-22 NOTE — Anesthesia Postprocedure Evaluation (Signed)
Anesthesia Post Note  Patient: Cindy Carpenter  Procedure(s) Performed: BLEPHAROPLASTY UPPER EYELID WITH EXCESS SKIN (Bilateral Eye)  Patient location during evaluation: PACU Anesthesia Type: MAC Level of consciousness: awake and alert Pain management: pain level controlled Vital Signs Assessment: post-procedure vital signs reviewed and stable Respiratory status: spontaneous breathing, nonlabored ventilation, respiratory function stable and patient connected to nasal cannula oxygen Cardiovascular status: stable and blood pressure returned to baseline Postop Assessment: no apparent nausea or vomiting Anesthetic complications: no    Veda Canning

## 2017-07-22 NOTE — Op Note (Signed)
Preoperative Diagnosis:  Visually significant dermatochalasis bilateral upper Eyelid(s)  Postoperative Diagnosis:  Same.  Procedure(s) Performed:   Upper eyelid blepharoplasty with excess skin excision bilateral upper Eyelid(s)  Teaching Surgeon: Philis Pique. Vickki Muff, M.D.  Assistants: none  Anesthesia: MAC  Specimens: None.  Estimated Blood Loss: Minimal.  Complications: None.  Operative Findings: None Dictated  Procedure:   Allergies were reviewed and the patient is allergic to Morphine; Other; Penicillin g; Codeine; and Shellfish allergy.   After the risks, benefits, complications and alternatives were discussed with the patient, appropriate informed consent was obtained and the patient was brought to the operating suite. The patient was reclined supine and a timeout was conducted.  The patient was then sedated.  Local anesthetic consisting of a 50-50 mixture of 2% lidocaine with epinephrine and 0.75% bupivacaine with added Hylenex was injected subcutaneously to both upper eyelid(s). After adequate local was instilled, the patient was prepped and draped in the usual sterile fashion for eyelid surgery.   Attention was turned to the upper eyelids. A 85m upper eyelid crease incision line was marked with calipers on both upper eyelid(s).  A pinch test was used to estimate the amount of excess skin to remove and this was marked in standard blepharoplasty style fashion. Attention was turned to the right upper eyelid. A #15 blade was used to open the premarked incision line. A skin only flap was excised and hemostasis was obtained with bipolar cautery.   A buttonhole was created medially in orbicularis and orbital septum to reveal the medial fat pocket. This was dissected free from fascial attachments, cauterized towards the pedicle base and excised to produce a nice flattening of the medial corner of the upper eyelid.  Attention was then turned to the opposite eyelid where the same  procedure was performed in the same manner. Hemostasis was obtained with bipolar cautery throughout. All incisions were then closed with a combination of running and interrupted 6-0 fast absorbing plain suture. The patient tolerated the procedure well.  Erythromycin ophthalmic ointment was applied to her incision sites, followed by ice packs. She was taken to the recovery area where she recovered without difficulty.  Post-Op Plan/Instructions:  The patient was instructed to use ice packs frequently for the next 48 hours. She was instructed to use erythromycin ophthalmic ointment on her incisions 4 times a day for the next 12 to 14 days. She was given a prescription for Percocet for pain control should Tylenol not be effective. [facility] [place] needed [place].  Viraj Liby M. FVickki Muff M.D. Attending,Ophthalmology

## 2017-07-22 NOTE — H&P (Signed)
See the history and physical completed at [facility] on 07/07/17 and scanned into the chart.

## 2017-07-23 ENCOUNTER — Encounter: Payer: Self-pay | Admitting: Ophthalmology

## 2017-08-20 ENCOUNTER — Other Ambulatory Visit: Payer: Self-pay | Admitting: Obstetrics and Gynecology

## 2017-08-20 DIAGNOSIS — Z1231 Encounter for screening mammogram for malignant neoplasm of breast: Secondary | ICD-10-CM

## 2017-08-28 DIAGNOSIS — D122 Benign neoplasm of ascending colon: Secondary | ICD-10-CM

## 2017-09-05 ENCOUNTER — Inpatient Hospital Stay: Admission: RE | Admit: 2017-09-05 | Source: Ambulatory Visit

## 2017-09-15 ENCOUNTER — Ambulatory Visit
Admission: RE | Admit: 2017-09-15 | Discharge: 2017-09-15 | Disposition: A | Discharge status: Home / Self Care | Source: Ambulatory Visit | Attending: Obstetrics and Gynecology | Admitting: Obstetrics and Gynecology

## 2017-09-15 DIAGNOSIS — Z1231 Encounter for screening mammogram for malignant neoplasm of breast: Secondary | ICD-10-CM

## 2018-02-05 ENCOUNTER — Ambulatory Visit: Attending: Orthopedic Surgery

## 2018-02-05 ENCOUNTER — Other Ambulatory Visit: Payer: Self-pay

## 2018-02-05 DIAGNOSIS — M25562 Pain in left knee: Secondary | ICD-10-CM | POA: Insufficient documentation

## 2018-02-05 DIAGNOSIS — G8929 Other chronic pain: Secondary | ICD-10-CM | POA: Insufficient documentation

## 2018-02-05 DIAGNOSIS — M6281 Muscle weakness (generalized): Secondary | ICD-10-CM

## 2018-02-05 DIAGNOSIS — R2689 Other abnormalities of gait and mobility: Secondary | ICD-10-CM | POA: Insufficient documentation

## 2018-02-05 NOTE — Therapy (Signed)
[place] MAIN [facility] SERVICES [address] [place], [place], [address] Phone: [phone]   Fax:  [phone]  Physical Therapy Evaluation  Patient Details  Name: Cindy Carpenter MRN: [id number] Date of Birth: May 12, 1955 Referring Provider (PT): Leim Fabry, MD   Encounter Date: 02/05/2018  PT End of Session - 02/05/18 1709    Visit Number  1    Number of Visits  16    Date for PT Re-Evaluation  04/02/18    Authorization Type  1/10     PT Start Time  1602    PT Stop Time  1655    PT Time Calculation (min)  53 min    Activity Tolerance  Patient tolerated treatment well    Behavior During Therapy  [facility] for tasks assessed/performed       Past Medical History:  Diagnosis Date  . Arthritis    hands, knees  . Bone spur   . DES exposure in utero   . Family history of adverse reaction to anesthesia    Mother - PONV  . GERD (gastroesophageal reflux disease)   . Headache    migraine - couple times per year  . Heart palpitations   . Hyperlipemia   . Insomnia   . Kidney stones   . Osteopenia   . PONV (postoperative nausea and vomiting)   . Vaginal atrophy     Past Surgical History:  Procedure Laterality Date  . ADENOIDECTOMY    . APPENDECTOMY    . ARTHROSCOPIC REPAIR ACL    . BROW LIFT Bilateral 07/22/2017   Procedure: BLEPHAROPLASTY UPPER EYELID WITH EXCESS SKIN;  Surgeon: Karle Starch, MD;  Location: [place];  Service: Ophthalmology;  Laterality: Bilateral;  . COLONOSCOPY WITH PROPOFOL N/A 09/17/2016   Procedure: COLONOSCOPY WITH PROPOFOL;  Surgeon: Lucilla Lame, MD;  Location: [facility] ENDOSCOPY;  Service: Endoscopy;  Laterality: N/A;  . FOOT SURGERY    . TONSILLECTOMY      There were no vitals filed for this visit.   Subjective Assessment - 02/05/18 1646    Subjective  Patient is a pleasant 62 year old female who presents to physical therapy for L knee OA for aquatic PT referral.     Pertinent History  Patient is  a pleasant 62 year old female who presents for L knee OA. 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  Patient history includes:  an ACL repair of L knee, bilateral broken feet,  chronic insomnia, GERD (gastroesophageal reflux disease), History of migraine headaches, History of pneumonia, Migraine headache (1966), Osteoarthrosis involving, or with mention of more than one site, but not specified as generalized, multiple sites, Osteopenia, Other and unspecified hyperlipidemia, and Radiculopathy of lumbar region. Patient reports her doctor told her she needs to strengthen quadriceps and LE's in non weightbearing.  L knee pain began years ago with recent increase to point of keeping patient awake at night. Has had injections before that helped short time. Enjoys swimming, working in yard, playing with dog, and going to [employer].     Limitations  Lifting;Walking;Standing;House hold activities;Other (comment)    How long can you sit comfortably?  back pain     How long can you stand comfortably?  10    How long can you walk comfortably?  200 ft     Patient Stated Goals  be able to work in yard and squat, decrease  pain.     Currently in Pain?  Yes    Pain Score  2     Pain Location  Knee    Pain Orientation  Left    Pain Descriptors / Indicators  Aching    Pain Type  Chronic pain    Pain Onset  More than a month ago    Pain Frequency  Constant    Aggravating Factors   prolonged weightbearing    Pain Relieving Factors  sitting down    Effect of Pain on Daily Activities  limits ability to squat, walk, and lift    Multiple Pain Sites  Yes    Pain Score  6    Pain Location  Back    Pain Orientation  Lower    Pain Descriptors / Indicators  Aching    Pain Type  Chronic pain    Pain Onset  More than a month ago    Pain Frequency  Constant    Aggravating Factors   standing, sitting    Pain Relieving Factors  laying down     Effect of Pain on Daily Activities  limits activities    Pain Score  2    Pain Location  Foot    Pain Orientation  Left    Pain Descriptors / Indicators  Aching    Pain Type  Chronic pain    Pain Onset  More than a month ago    Pain Frequency  Constant    Aggravating Factors   weightbearing    Pain Relieving Factors  sitting/laying down    Effect of Pain on Daily Activities  limits activities         [facility] PT Assessment - 02/05/18 0001      Assessment   Medical Diagnosis  L knee OA    Referring Provider (PT)  Leim Fabry, MD    Onset Date/Surgical Date  --   multiple years ago    Hand Dominance  Right    Next MD Visit  --   patient not sure   Prior Therapy  yes for ACL repair and rotator cuff      Precautions   Precautions  None      Balance Screen   Has the patient fallen in the past 6 months  No    Has the patient had a decrease in activity level because of a fear of falling?   Yes    Is the patient reluctant to leave their home because of a fear of falling?   Yes      [place] residence    Living Arrangements  Spouse/significant other    Available Help at Discharge  Family    Type of [place] to enter    Entrance Stairs-Number of Steps  1    [place] to live on main level with bedroom/bathroom    Home Equipment  Shower seat;Toilet riser      Prior Function   Level of Independence  Independent    Vocation  Part time employment    Vocation Requirements  Bending, twisting, lifting: Nurse for cardiac     Leisure  playing with dog, quilt, [place], read., swim      Cognition   Overall Cognitive Status  Within Functional Limits for tasks assessed      PAIN:  Current Pain : L  knee 2/10                         Back pain: 6/10   L foot 2/10 Worst pain: L knee: 7/10          L foot: 7/10                      Back pain: 8.5/10  POSTURE: Seated: weight shift onto R  pelvis Standing: limited weight shift onto LLE (shifted to R) decreased knee extension LLE   PROM/AROM:  Active ROM knee in Supine:   Extension L -4 R neutral  Flexion; L 123 R 124 Hamstring length : limited RLE, WFL LLE   STRENGTH:  Graded on a 0-5 scale Muscle Group Left Right  Hip Flex 4/5 4/5  Hip Abd 3/5 3+/5  Hip Add 2-/5 2/5  Hip Ext 2/5 3+/5  Knee Flex 4-/5 4-/5  Knee Ext 3+/5 4-/5  Ankle DF 3/5 3/5  Ankle PF 3/5 3/5   SENSATION: WFL  SPECIAL TESTS: Anterior Drawer : - negative Posterior Drawer - negative Valgus/Varus stress test: -negative Meniscus stress- negative  FUNCTIONAL MOBILITY: Squat: Patient unable to perform without increased pain and excessive trunk flexion  Sit to stand: requires use of hands on knees.   Bed mobility: rolling L and R: increased time required due to low back pain.   BALANCE: Good; unable to maintain SLS on LLE  GAIT: Noted limp of LLE with decreased weightbearing during stance phase. Limited knee extension of L knee. No AD required.   OUTCOME MEASURES: TEST Outcome Interpretation  5 times sit<>stand 18 sec with knees on hands >60 yo, >15 sec indicates increased risk for falls  6 minute walk test   1345             Feet 1000 feet is community Ambulator: 60 is age norm  LEFS 39/80           HeP: Seated LAQ 10x Seated Hamstring stretch 2 x60 seconds          Objective measurements completed on examination: See above findings.              PT Education - 02/05/18 1709    Education Details  aquatic therapy, goals, POC     Person(s) Educated  Patient    Methods  Explanation    Comprehension  Verbalized understanding       PT Short Term Goals - 02/05/18 1717      PT SHORT TERM GOAL #1   Title  Patient will be independent in home exercise program to improve strength/mobility for better functional independence with ADLs.    Baseline  HEP given     Time  2    Period  Weeks    Status  New     Target Date  02/19/18      PT SHORT TERM GOAL #2   Title  Patient will perform 2 sit to stands without UE support to increase LE strength and mobility     Baseline  Requires UE on knees    Time  2    Period  Weeks    Status  New    Target Date  02/19/18      PT SHORT TERM GOAL #3   Title  Patient will report a worst pain of 5/10 on VAS in  L knee  to improve tolerance with ADLs and reduced symptoms with  activities.     Baseline  10/31: 7/10     Time  2    Period  Weeks    Status  New    Target Date  02/19/18        PT Long Term Goals - 02/05/18 1720      PT LONG TERM GOAL #1   Title  Patient will report a worst pain of 3/10 on VAS in  L kee to improve tolerance with ADLs and reduced symptoms with activities.     Baseline  10/31: 7/10     Time  8    Period  Weeks    Status  New    Target Date  04/02/18      PT LONG TERM GOAL #2   Title  Patient will increase BLE gross strength to 4+/5 as to improve functional strength for independent gait, increased standing tolerance and increased ADL ability.    Baseline  10/31: L 3/5 gross with 2/5 add/ext     Time  8    Period  Weeks    Status  New    Target Date  04/02/18      PT LONG TERM GOAL #3   Title  Patient will increase lower extremity functional scale to >60/80 to demonstrate improved functional mobility and increased tolerance with ADLs.     Baseline  10/31: 39/80    Time  8    Period  Weeks    Status  New    Target Date  04/02/18      PT LONG TERM GOAL #4   Title  Patient will increase six minute walk test distance to >1800 for progression to age norm and improve gait ability    Baseline  10/31: 1345 with pain     Time  8    Period  Weeks    Status  New    Target Date  04/02/18      PT LONG TERM GOAL #5   Title  Patient (> 54 years old) will complete five times sit to stand test in < 15 seconds indicating an increased LE strength and improved balance.    Baseline  10/31: 18 seconds with hands on knees    Time   8    Period  Weeks    Status  New    Target Date  04/02/18             Plan - 02/05/18 1712    Clinical Impression Statement  Patient is a pleasant 62 year old female who presents to physical therapy for evaluation for aquatic PT for L knee OA. Patient has constant pain of L knee which increases with weightbearing, L foot and low back additionally are limited in weightbearing interventions. Patient ambulates with antalgic gait resulting in poor weightbearing on LLE. She requires use of UEs on knees to perform sit to stand and has diffuse weakness of LE's with L>R. Patient will benefit from skilled physical therapy to reduce pain, improve strength, and increase mobility for quality of life.     History and Personal Factors relevant to plan of care:  This patient presents with 1- 2,  personal factors/ comorbidities  and 3 , body elements including body structures and functions, activity limitations and or participation restrictions. Patient's condition is  evolving,     Clinical Presentation  Evolving    Clinical Presentation due to:  progressively worsening with weightbearing, progressively decreasing ability to perform iADLs    Clinical  Decision Making  Moderate    Rehab Potential  Good    Clinical Impairments Affecting Rehab Potential  (+) motivation, education, works at [employer] (-) chronicity of pain    PT Frequency  2x / week    PT Duration  8 weeks    PT Treatment/Interventions  ADLs/Self Care Home Management;Aquatic Therapy;Biofeedback;Cryotherapy;Electrical Stimulation;Moist Heat;Iontophoresis 4mg /ml Dexamethasone;Traction;Ultrasound;Balance training;Therapeutic exercise;Therapeutic activities;Functional mobility training;Stair training;Gait training;Neuromuscular re-education;Patient/family education;Compression bandaging;Manual techniques;Passive range of motion;Dry needling;Energy conservation;Taping    PT Next Visit Plan  aquatic PT    PT Home Exercise Plan  seated LAQ      Recommended Other Services  n/a     Consulted and Agree with Plan of Care  Patient       Patient will benefit from skilled therapeutic intervention in order to improve the following deficits and impairments:  Abnormal gait, Decreased activity tolerance, Decreased endurance, Decreased mobility, Difficulty walking, Decreased strength, Hypomobility, Impaired flexibility, Impaired perceived functional ability, Postural dysfunction, Improper body mechanics, Pain  Visit Diagnosis: Chronic pain of left knee  Other abnormalities of gait and mobility  Muscle weakness (generalized)     Problem List Patient Active Problem List   Diagnosis Date Noted  . History of colon polyps   . Benign neoplasm of cecum   . Benign neoplasm of ascending colon   . Polyp of sigmoid colon   . Benign neoplasm of transverse colon   . Acid reflux 08/02/2016  . Degenerative joint disease involving multiple joints 08/02/2016  . Osteopenia 08/02/2016  . Pure hypercholesterolemia 08/02/2016  . Lumbar radiculopathy 01/05/2015  . Bursitis of hip 11/03/2014  . Pain in right hip 11/03/2014  . Complete rotator cuff rupture of left shoulder 04/05/2014  . Chronic cervical pain 03/01/2014  . Pain in shoulder 03/01/2014  . Impingement syndrome of left shoulder 02/22/2014  . Impingement syndrome of shoulder 02/22/2014   Janna Arch, PT, DPT   02/05/2018, 5:23 PM  [place] MAIN [facility] SERVICES [address] [place], [place], [address] Phone: [phone]   Fax:  [phone]  Name: Cindy Carpenter MRN: [id number] Date of Birth: 1956-03-17

## 2018-02-10 ENCOUNTER — Ambulatory Visit: Attending: Orthopedic Surgery

## 2018-02-10 ENCOUNTER — Other Ambulatory Visit: Payer: Self-pay

## 2018-02-10 DIAGNOSIS — M6281 Muscle weakness (generalized): Secondary | ICD-10-CM

## 2018-02-10 DIAGNOSIS — M25562 Pain in left knee: Secondary | ICD-10-CM | POA: Insufficient documentation

## 2018-02-10 DIAGNOSIS — G8929 Other chronic pain: Secondary | ICD-10-CM | POA: Diagnosis present

## 2018-02-10 DIAGNOSIS — R2689 Other abnormalities of gait and mobility: Secondary | ICD-10-CM | POA: Diagnosis present

## 2018-02-10 NOTE — Therapy (Signed)
[place] MAIN [facility] SERVICES [address] [place], [place], [address] Phone: [phone]   Fax:  [phone]  Physical Therapy Treatment  Patient Details  Name: Cindy Carpenter MRN: [id number] Date of Birth: November 26, 1955 Referring Provider (PT): Leim Fabry, MD   Encounter Date: 02/10/2018  PT End of Session - 02/10/18 1252    Visit Number  2    Number of Visits  16    Date for PT Re-Evaluation  04/02/18    Authorization Type  2/10    PT Start Time  0945    PT Stop Time  1040    PT Time Calculation (min)  55 min    Activity Tolerance  Patient tolerated treatment well    Behavior During Therapy  [facility] for tasks assessed/performed       Past Medical History:  Diagnosis Date  . Arthritis    hands, knees  . Bone spur   . DES exposure in utero   . Family history of adverse reaction to anesthesia    Mother - PONV  . GERD (gastroesophageal reflux disease)   . Headache    migraine - couple times per year  . Heart palpitations   . Hyperlipemia   . Insomnia   . Kidney stones   . Osteopenia   . PONV (postoperative nausea and vomiting)   . Vaginal atrophy     Past Surgical History:  Procedure Laterality Date  . ADENOIDECTOMY    . APPENDECTOMY    . ARTHROSCOPIC REPAIR ACL    . BROW LIFT Bilateral 07/22/2017   Procedure: BLEPHAROPLASTY UPPER EYELID WITH EXCESS SKIN;  Surgeon: Karle Starch, MD;  Location: [place];  Service: Ophthalmology;  Laterality: Bilateral;  . COLONOSCOPY WITH PROPOFOL N/A 09/17/2016   Procedure: COLONOSCOPY WITH PROPOFOL;  Surgeon: Lucilla Lame, MD;  Location: [facility] ENDOSCOPY;  Service: Endoscopy;  Laterality: N/A;  . FOOT SURGERY    . TONSILLECTOMY      There were no vitals filed for this visit.  Subjective Assessment - 02/10/18 1248    Subjective  Pt reports L knee pain is 4/10 and recent (1 week) LB pain. Describes bathing dog in tub and gait disturbances due to knee, which more than likely is  causing back pain.     Pertinent History  Patient is a pleasant 62 year old female who presents for L knee OA. 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  Patient history includes:  an ACL repair of L knee, bilateral broken feet,  chronic insomnia, GERD (gastroesophageal reflux disease), History of migraine headaches, History of pneumonia, Migraine headache (1966), Osteoarthrosis involving, or with mention of more than one site, but not specified as generalized, multiple sites, Osteopenia, Other and unspecified hyperlipidemia, and Radiculopathy of lumbar region. Patient reports her doctor told her she needs to strengthen quadriceps and LE's in non weightbearing.  L knee pain began years ago with recent increase to point of keeping patient awake at night. Has had injections before that helped short time. Enjoys swimming, working in yard, playing with dog, and going to [employer].       Enters/exits via ramp  Ambulation, warm up  4 L fwd  4 L side  2 L side with minisquat  Core and LE  hip abd/add, B 20x  Hip flex/ext, B 20x  Squats, 20x  Suspended work  increased time for initial teaching  Jog   Barnabas Lister   Ski   X jack   [employer]  5 min work, 1 min each followed by 2 L recovery walk with thumb up arm drag  Bench, core with LE  Up and outs, 20x ea  5 min bike  Active stretching  Ham/gastroc and hip flexor/quad  Runners gastroc/soleus                            PT Education - 02/10/18 1250    Education Details  Properties and benefits of water as it pertains to exercise. Core stabilization for back protection and strength. Suspended work. Active stretching    Person(s) Educated  Patient    Methods  Explanation;Demonstration;Verbal cues;Tactile cues    Comprehension  Verbalized understanding;Returned demonstration;Verbal cues required       PT Short Term Goals - 02/05/18 1717       PT SHORT TERM GOAL #1   Title  Patient will be independent in home exercise program to improve strength/mobility for better functional independence with ADLs.    Baseline  HEP given     Time  2    Period  Weeks    Status  New    Target Date  02/19/18      PT SHORT TERM GOAL #2   Title  Patient will perform 2 sit to stands without UE support to increase LE strength and mobility     Baseline  Requires UE on knees    Time  2    Period  Weeks    Status  New    Target Date  02/19/18      PT SHORT TERM GOAL #3   Title  Patient will report a worst pain of 5/10 on VAS in  L knee  to improve tolerance with ADLs and reduced symptoms with activities.     Baseline  10/31: 7/10     Time  2    Period  Weeks    Status  New    Target Date  02/19/18        PT Long Term Goals - 02/05/18 1720      PT LONG TERM GOAL #1   Title  Patient will report a worst pain of 3/10 on VAS in  L kee to improve tolerance with ADLs and reduced symptoms with activities.     Baseline  10/31: 7/10     Time  8    Period  Weeks    Status  New    Target Date  04/02/18      PT LONG TERM GOAL #2   Title  Patient will increase BLE gross strength to 4+/5 as to improve functional strength for independent gait, increased standing tolerance and increased ADL ability.    Baseline  10/31: L 3/5 gross with 2/5 add/ext     Time  8    Period  Weeks    Status  New    Target Date  04/02/18      PT LONG TERM GOAL #3   Title  Patient will increase lower extremity functional scale to >60/80 to demonstrate improved functional mobility and increased tolerance with ADLs.     Baseline  10/31: 39/80    Time  8    Period  Weeks    Status  New    Target Date  04/02/18      PT LONG TERM GOAL #4   Title  Patient  will increase six minute walk test distance to >1800 for progression to age norm and improve gait ability    Baseline  10/31: 1345 with pain     Time  8    Period  Weeks    Status  New    Target Date  04/02/18       PT LONG TERM GOAL #5   Title  Patient (> 28 years old) will complete five times sit to stand test in < 15 seconds indicating an increased LE strength and improved balance.    Baseline  10/31: 18 seconds with hands on knees    Time  8    Period  Weeks    Status  New    Target Date  04/02/18            Plan - 02/10/18 1253    Clinical Impression Statement  Pt tolerates session well. Decrease of L knee and back pain in water. Occasional mild back pain felt without proper stabilization, but remedied with proper technieque. Pt pleased with ability to move in painfree environment and eager to progress exercise/activity.     Rehab Potential  Good    Clinical Impairments Affecting Rehab Potential  (+) motivation, education, works at [employer] (-) chronicity of pain    PT Frequency  2x / week    PT Duration  8 weeks    PT Treatment/Interventions  ADLs/Self Care Home Management;Aquatic Therapy;Biofeedback;Cryotherapy;Electrical Stimulation;Moist Heat;Iontophoresis 4mg /ml Dexamethasone;Traction;Ultrasound;Balance training;Therapeutic exercise;Therapeutic activities;Functional mobility training;Stair training;Gait training;Neuromuscular re-education;Patient/family education;Compression bandaging;Manual techniques;Passive range of motion;Dry needling;Energy conservation;Taping    PT Next Visit Plan  aquatic PT    PT Home Exercise Plan  seated LAQ     Consulted and Agree with Plan of Care  Patient       Patient will benefit from skilled therapeutic intervention in order to improve the following deficits and impairments:  Abnormal gait, Decreased activity tolerance, Decreased endurance, Decreased mobility, Difficulty walking, Decreased strength, Hypomobility, Impaired flexibility, Impaired perceived functional ability, Postural dysfunction, Improper body mechanics, Pain  Visit Diagnosis: Chronic pain of left knee  Other abnormalities of gait and mobility  Muscle weakness  (generalized)     Problem List Patient Active Problem List   Diagnosis Date Noted  . History of colon polyps   . Benign neoplasm of cecum   . Benign neoplasm of ascending colon   . Polyp of sigmoid colon   . Benign neoplasm of transverse colon   . Acid reflux 08/02/2016  . Degenerative joint disease involving multiple joints 08/02/2016  . Osteopenia 08/02/2016  . Pure hypercholesterolemia 08/02/2016  . Lumbar radiculopathy 01/05/2015  . Bursitis of hip 11/03/2014  . Pain in right hip 11/03/2014  . Complete rotator cuff rupture of left shoulder 04/05/2014  . Chronic cervical pain 03/01/2014  . Pain in shoulder 03/01/2014  . Impingement syndrome of left shoulder 02/22/2014  . Impingement syndrome of shoulder 02/22/2014    Larae Grooms 02/10/2018, 12:55 PM  [place] MAIN [facility] SERVICES [address] [place], [place], [address] Phone: [phone]   Fax:  [phone]  Name: JATIA MUSA MRN: [id number] Date of Birth: April 27, 1955

## 2018-02-17 ENCOUNTER — Encounter: Payer: Self-pay | Admitting: Physical Therapy

## 2018-02-17 ENCOUNTER — Ambulatory Visit: Admitting: Physical Therapy

## 2018-02-17 NOTE — Therapy (Signed)
[place] MAIN [facility] SERVICES [address] [place], [place], [address] Phone: [phone]   Fax:  [phone]  Physical Therapy Treatment  Patient Details  Name: Cindy Carpenter MRN: [id number] Date of Birth: October 06, 1955 Referring Provider (PT): Leim Fabry, MD   Encounter Date: 02/17/2018  PT End of Session - 02/17/18 1350    Visit Number  3    Number of Visits  16    Date for PT Re-Evaluation  04/02/18    Authorization Type  3/10    PT Start Time  0950    PT Stop Time  1035    PT Time Calculation (min)  45 min    Activity Tolerance  Patient tolerated treatment well    Behavior During Therapy  [facility] for tasks assessed/performed       Past Medical History:  Diagnosis Date  . Arthritis    hands, knees  . Bone spur   . DES exposure in utero   . Family history of adverse reaction to anesthesia    Mother - PONV  . GERD (gastroesophageal reflux disease)   . Headache    migraine - couple times per year  . Heart palpitations   . Hyperlipemia   . Insomnia   . Kidney stones   . Osteopenia   . PONV (postoperative nausea and vomiting)   . Vaginal atrophy     Past Surgical History:  Procedure Laterality Date  . ADENOIDECTOMY    . APPENDECTOMY    . ARTHROSCOPIC REPAIR ACL    . BROW LIFT Bilateral 07/22/2017   Procedure: BLEPHAROPLASTY UPPER EYELID WITH EXCESS SKIN;  Surgeon: Karle Starch, MD;  Location: [place];  Service: Ophthalmology;  Laterality: Bilateral;  . COLONOSCOPY WITH PROPOFOL N/A 09/17/2016   Procedure: COLONOSCOPY WITH PROPOFOL;  Surgeon: Lucilla Lame, MD;  Location: [facility] ENDOSCOPY;  Service: Endoscopy;  Laterality: N/A;  . FOOT SURGERY    . TONSILLECTOMY      There were no vitals filed for this visit.  Subjective Assessment - 02/17/18 1348    Subjective  Reports feeling "good".  She did report having a fall over a mop handle when entering her home this past week.  Some R upper chest soreness and L knee  bruising but denies pain currently.    Pertinent History  Patient is a pleasant 62 year old female who presents for L knee OA. 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  Patient history includes:  an ACL repair of L knee, bilateral broken feet,  chronic insomnia, GERD (gastroesophageal reflux disease), History of migraine headaches, History of pneumonia, Migraine headache (1966), Osteoarthrosis involving, or with mention of more than one site, but not specified as generalized, multiple sites, Osteopenia, Other and unspecified hyperlipidemia, and Radiculopathy of lumbar region. Patient reports her doctor told her she needs to strengthen quadriceps and LE's in non weightbearing.  L knee pain began years ago with recent increase to point of keeping patient awake at night. Has had injections before that helped short time. Enjoys swimming, working in yard, playing with dog, and going to [employer].        PT aquatic session  Enters/exits via ramp  Ambulation warm up 4 laps forward 4 laps sideways 2 laps side with mini squat  Core and LE Hip ab/add B x 20 Hip flex/ext B x 10 Squats x 20  Suspended work  1  minute each Jog Barnabas Lister Ski X-jack [employer]   2 laps recovery with thumb up drag  Bench core LE 5 minute each Bike Flutter 3 minute scissor  Active stretching Ham/gastroc and hip flexor/quad Runners gastroc/soleus       PT Education - 02/17/18 1349    Education Details  General exercise techniques during session.    Person(s) Educated  Patient    Methods  Explanation;Demonstration;Verbal cues    Comprehension  Verbalized understanding;Returned demonstration       PT Short Term Goals - 02/05/18 1717      PT SHORT TERM GOAL #1   Title  Patient will be independent in home exercise program to improve strength/mobility for better functional independence with ADLs.    Baseline  HEP given     Time   2    Period  Weeks    Status  New    Target Date  02/19/18      PT SHORT TERM GOAL #2   Title  Patient will perform 2 sit to stands without UE support to increase LE strength and mobility     Baseline  Requires UE on knees    Time  2    Period  Weeks    Status  New    Target Date  02/19/18      PT SHORT TERM GOAL #3   Title  Patient will report a worst pain of 5/10 on VAS in  L knee  to improve tolerance with ADLs and reduced symptoms with activities.     Baseline  10/31: 7/10     Time  2    Period  Weeks    Status  New    Target Date  02/19/18        PT Long Term Goals - 02/05/18 1720      PT LONG TERM GOAL #1   Title  Patient will report a worst pain of 3/10 on VAS in  L kee to improve tolerance with ADLs and reduced symptoms with activities.     Baseline  10/31: 7/10     Time  8    Period  Weeks    Status  New    Target Date  04/02/18      PT LONG TERM GOAL #2   Title  Patient will increase BLE gross strength to 4+/5 as to improve functional strength for independent gait, increased standing tolerance and increased ADL ability.    Baseline  10/31: L 3/5 gross with 2/5 add/ext     Time  8    Period  Weeks    Status  New    Target Date  04/02/18      PT LONG TERM GOAL #3   Title  Patient will increase lower extremity functional scale to >60/80 to demonstrate improved functional mobility and increased tolerance with ADLs.     Baseline  10/31: 39/80    Time  8    Period  Weeks    Status  New    Target Date  04/02/18      PT LONG TERM GOAL #4   Title  Patient will increase six minute walk test distance to >1800 for progression to age norm and improve gait ability    Baseline  10/31: 1345 with pain     Time  8    Period  Weeks    Status  New    Target Date  04/02/18      PT LONG TERM  GOAL #5   Title  Patient (> 12 years old) will complete five times sit to stand test in < 15 seconds indicating an increased LE strength and improved balance.    Baseline  10/31:  18 seconds with hands on knees    Time  8    Period  Weeks    Status  New    Target Date  04/02/18            Plan - 02/17/18 1352    Clinical Impression Statement  Pt tolerated well.  Enjoyed movements and activiites that would have been difficult or impossible for her out of the water.    Clinical Presentation  Evolving    Clinical Decision Making  Moderate    Rehab Potential  Good    Clinical Impairments Affecting Rehab Potential  (+) motivation, education, works at [employer] (-) chronicity of pain    PT Frequency  2x / week    PT Duration  8 weeks    PT Treatment/Interventions  ADLs/Self Care Home Management;Aquatic Therapy;Biofeedback;Cryotherapy;Electrical Stimulation;Moist Heat;Iontophoresis 4mg /ml Dexamethasone;Traction;Ultrasound;Balance training;Therapeutic exercise;Therapeutic activities;Functional mobility training;Stair training;Gait training;Neuromuscular re-education;Patient/family education;Compression bandaging;Manual techniques;Passive range of motion;Dry needling;Energy conservation;Taping    PT Next Visit Plan  aquatic PT    Consulted and Agree with Plan of Care  Patient       Patient will benefit from skilled therapeutic intervention in order to improve the following deficits and impairments:  Abnormal gait, Decreased activity tolerance, Decreased endurance, Decreased mobility, Difficulty walking, Decreased strength, Hypomobility, Impaired flexibility, Impaired perceived functional ability, Postural dysfunction, Improper body mechanics, Pain  Visit Diagnosis: Muscle weakness (generalized)  Other abnormalities of gait and mobility  Chronic pain of left knee     Problem List Patient Active Problem List   Diagnosis Date Noted  . History of colon polyps   . Benign neoplasm of cecum   . Benign neoplasm of ascending colon   . Polyp of sigmoid colon   . Benign neoplasm of transverse colon   . Acid reflux 08/02/2016  . Degenerative joint disease involving  multiple joints 08/02/2016  . Osteopenia 08/02/2016  . Pure hypercholesterolemia 08/02/2016  . Lumbar radiculopathy 01/05/2015  . Bursitis of hip 11/03/2014  . Pain in right hip 11/03/2014  . Complete rotator cuff rupture of left shoulder 04/05/2014  . Chronic cervical pain 03/01/2014  . Pain in shoulder 03/01/2014  . Impingement syndrome of left shoulder 02/22/2014  . Impingement syndrome of shoulder 02/22/2014   Chesley Noon, PTA 02/17/18, 1:59 PM   [place] MAIN [facility] SERVICES [address] [place], [place], [address] Phone: [phone]   Fax:  [phone]  Name: Cindy Carpenter MRN: [id number] Date of Birth: 05/05/1955

## 2018-02-24 ENCOUNTER — Ambulatory Visit

## 2018-03-03 ENCOUNTER — Ambulatory Visit

## 2018-03-08 HISTORY — PX: REDUCTION MAMMAPLASTY: SUR839

## 2018-03-10 ENCOUNTER — Encounter

## 2018-08-26 DIAGNOSIS — R7309 Other abnormal glucose: Secondary | ICD-10-CM | POA: Insufficient documentation

## 2018-10-29 ENCOUNTER — Other Ambulatory Visit: Payer: Self-pay | Admitting: Internal Medicine

## 2018-10-29 DIAGNOSIS — Z1231 Encounter for screening mammogram for malignant neoplasm of breast: Secondary | ICD-10-CM

## 2018-11-30 ENCOUNTER — Ambulatory Visit
Admission: RE | Admit: 2018-11-30 | Discharge: 2018-11-30 | Disposition: A | Discharge status: Home / Self Care | Payer: Medicare Other | Source: Ambulatory Visit | Attending: Internal Medicine | Admitting: Internal Medicine

## 2018-11-30 ENCOUNTER — Ambulatory Visit
Admission: EM | Admit: 2018-11-30 | Discharge: 2018-11-30 | Disposition: A | Discharge status: Home / Self Care | Payer: Medicare Other | Attending: Family Medicine | Admitting: Family Medicine

## 2018-11-30 ENCOUNTER — Other Ambulatory Visit: Payer: Self-pay

## 2018-11-30 ENCOUNTER — Encounter (INDEPENDENT_AMBULATORY_CARE_PROVIDER_SITE_OTHER): Payer: Self-pay

## 2018-11-30 DIAGNOSIS — Z1231 Encounter for screening mammogram for malignant neoplasm of breast: Secondary | ICD-10-CM

## 2018-11-30 DIAGNOSIS — S39012A Strain of muscle, fascia and tendon of lower back, initial encounter: Secondary | ICD-10-CM

## 2018-11-30 DIAGNOSIS — X500XXA Overexertion from strenuous movement or load, initial encounter: Secondary | ICD-10-CM | POA: Diagnosis not present

## 2018-11-30 MED ORDER — PREDNISONE 10 MG PO TABS: ORAL_TABLET | ORAL | 0 refills | Status: DC

## 2018-11-30 MED ORDER — HYDROCODONE-ACETAMINOPHEN 5-325 MG PO TABS: ORAL_TABLET | ORAL | 0 refills | Status: DC

## 2018-11-30 MED ORDER — CYCLOBENZAPRINE HCL 5 MG PO TABS: 5.0000 mg | ORAL_TABLET | Freq: Three times a day (TID) | ORAL | 0 refills | Status: DC | PRN

## 2018-11-30 NOTE — Discharge Instructions (Signed)
Heat to area Gentle/easy low back stretching

## 2018-11-30 NOTE — ED Notes (Signed)
Pt in treatment room.  In NAD.  Needs address.  Pt/Fam updated on POC.

## 2018-11-30 NOTE — ED Triage Notes (Signed)
Last Tuesday sitting on floor cleaning pantry. When attempted to stand could not.  Has been having back pain since.  Limiting ADLs.  unaable to pick things up or move correctly.  Home remedies not helping.   Getting in comfortable position is difficult

## 2018-12-21 NOTE — ED Provider Notes (Signed)
[facility]    CSN: [id number] Arrival date & time: 11/30/18  1007      History   Chief Complaint Chief Complaint  Patient presents with  . Back Pain    HPI Cindy Carpenter is a 63 y.o. female.   63 yo female with a c/o low back pain for the past week after injuring while standing back up from a bending position. Denies any fall or direct traumatic injury. Pain is worse with certain movements. Denies any numbness/tingling, pain radiating down the legs, bowel or bladder problems.    Back Pain   Past Medical History:  Diagnosis Date  . Arthritis    hands, knees  . Bone spur   . DES exposure in utero   . Family history of adverse reaction to anesthesia    Mother - PONV  . GERD (gastroesophageal reflux disease)   . Headache    migraine - couple times per year  . Heart palpitations   . Hyperlipemia   . Insomnia   . Kidney stones   . Osteopenia   . PONV (postoperative nausea and vomiting)   . Vaginal atrophy     Patient Active Problem List   Diagnosis Date Noted  . History of colon polyps   . Benign neoplasm of cecum   . Benign neoplasm of ascending colon   . Polyp of sigmoid colon   . Benign neoplasm of transverse colon   . Acid reflux 08/02/2016  . Degenerative joint disease involving multiple joints 08/02/2016  . Osteopenia 08/02/2016  . Pure hypercholesterolemia 08/02/2016  . Lumbar radiculopathy 01/05/2015  . Bursitis of hip 11/03/2014  . Pain in right hip 11/03/2014  . Complete rotator cuff rupture of left shoulder 04/05/2014  . Chronic cervical pain 03/01/2014  . Pain in shoulder 03/01/2014  . Impingement syndrome of left shoulder 02/22/2014  . Impingement syndrome of shoulder 02/22/2014    Past Surgical History:  Procedure Laterality Date  . ADENOIDECTOMY    . APPENDECTOMY    . ARTHROSCOPIC REPAIR ACL    . BROW LIFT Bilateral 07/22/2017   Procedure: BLEPHAROPLASTY UPPER EYELID WITH EXCESS SKIN;  Surgeon: Karle Starch, MD;   Location: [facility];  Service: Ophthalmology;  Laterality: Bilateral;  . COLONOSCOPY WITH PROPOFOL N/A 09/17/2016   Procedure: COLONOSCOPY WITH PROPOFOL;  Surgeon: Lucilla Lame, MD;  Location: [facility] ENDOSCOPY;  Service: Endoscopy;  Laterality: N/A;  . FOOT SURGERY    . REDUCTION MAMMAPLASTY Bilateral 03/2018  . TONSILLECTOMY      OB History    Gravida  0   Para  0   Term  0   Preterm  0   AB  0   Living  0     SAB  0   TAB  0   Ectopic  0   Multiple  0   Live Births               Home Medications    Prior to Admission medications   Medication Sig Start Date End Date Taking? Authorizing Provider  butalbital-aspirin-caffeine [facility]) [phone] MG capsule Take by mouth. 01/05/15   [provider]  Calcium Carb-Cholecalciferol (CALCIUM-VITAMIN D) 500-200 MG-UNIT tablet Take by mouth.    [provider]  cyclobenzaprine (FLEXERIL) 5 MG tablet Take 1 tablet (5 mg total) by mouth 3 (three) times daily as needed for muscle spasms. 11/30/18   Norval Gable, MD  erythromycin [facility]) ophthalmic ointment Use a small amount on your sutures  4 times a day for the next 2 weeks. Switch to Aquaphor ointment should allergy develop. Patient not taking: Reported on 02/05/2018 07/22/17   Karle Starch, MD  estradiol (ESTRACE) 0.1 MG/GM vaginal cream Place vaginally. 06/08/15   [provider]  HYDROcodone-acetaminophen (NORCO/VICODIN) 5-325 MG tablet 1-2 tabs po bid prn 11/30/18   Norval Gable, MD  levothyroxine (SYNTHROID, LEVOTHROID) 50 MCG tablet Take 50 mcg by mouth daily before breakfast.    [provider]  Multiple Vitamin (MULTI-VITAMINS) TABS Take by mouth.    [provider]  omeprazole (PRILOSEC) 10 MG capsule Take by mouth.    [provider]  predniSONE (DELTASONE) 10 MG tablet Start 60 mg po day one, then 50 mg po day two, taper by 10 mg daily until complete. 11/30/18   Norval Gable, MD  raloxifene (EVISTA)  60 MG tablet Take by mouth. 02/04/14   [provider]  raloxifene (EVISTA) 60 MG tablet Take 60 mg by mouth daily.    [provider]  simvastatin (ZOCOR) 20 MG tablet Take by mouth. 02/04/14   [provider]  traMADol (ULTRAM) 50 MG tablet Take 1 every 4-6 hours as needed for pain not controlled by Tylenol Patient not taking: Reported on 02/05/2018 07/22/17   Karle Starch, MD  traZODone (DESYREL) 50 MG tablet  03/04/14   [provider]    Family History Family History  Problem Relation Age of Onset  . Heart disease Mother   . Diabetes Mother   . Heart disease Father   . Diabetes Father   . Hypertension Father   . Diabetes Brother   . Ovarian cancer Maternal Grandmother   . Breast cancer Neg Hx   . Colon cancer Neg Hx     Social History Social History   Tobacco Use  . Smoking status: Never Smoker  . Smokeless tobacco: Never Used  Substance Use Topics  . Alcohol use: Not Currently    Comment: occas  . Drug use: No     Allergies   Morphine, Other, Penicillin g, Codeine, and Shellfish allergy   Review of Systems Review of Systems  Musculoskeletal: Positive for back pain.     Physical Exam Triage Vital Signs ED Triage Vitals  Enc Vitals Group     BP 11/30/18 1027 136/79     Pulse Rate 11/30/18 1027 86     Resp 11/30/18 1027 16     Temp 11/30/18 1027 98.3 F (36.8 C)     Temp src --      SpO2 --      Weight --      Height --      Head Circumference --      Peak Flow --      Pain Score 11/30/18 1030 5     Pain Loc --      Pain Edu? --      Excl. in [place]? --    No data found.  Updated Vital Signs BP 136/79 (BP Location: Right Arm)   Pulse 86   Temp 98.3 F (36.8 C)   Resp 16   Visual Acuity Right Eye Distance:   Left Eye Distance:   Bilateral Distance:    Right Eye Near:   Left Eye Near:    Bilateral Near:     Physical Exam Vitals signs and nursing note reviewed.  Constitutional:      General: She is  not in acute distress.    Appearance: She is  not toxic-appearing or diaphoretic.  Musculoskeletal:     Lumbar back: She exhibits tenderness (over the lumbar paraspinous muscles) and spasm (over the lumbar paraspinous muscles). She exhibits normal range of motion, no bony tenderness, no swelling, no edema, no deformity, no laceration and normal pulse.  Neurological:     Mental Status: She is alert.      UC Treatments / Results  Labs (all labs ordered are listed, but only abnormal results are displayed) Labs Reviewed - No data to display  EKG   Radiology No results found.  Procedures Procedures (including critical care time)  Medications Ordered in UC Medications - No data to display  Initial Impression / Assessment and Plan / UC Course  I have reviewed the triage vital signs and the nursing notes.  Pertinent labs & imaging results that were available during my care of the patient were reviewed by me and considered in my medical decision making (see chart for details).      Final Clinical Impressions(s) / UC Diagnoses   Final diagnoses:  Strain of lumbar region, initial encounter     Discharge Instructions     Heat to area Gentle/easy low back stretching    ED Prescriptions    Medication Sig Dispense Auth. Provider   cyclobenzaprine (FLEXERIL) 5 MG tablet Take 1 tablet (5 mg total) by mouth 3 (three) times daily as needed for muscle spasms. 30 tablet Norval Gable, MD   predniSONE (DELTASONE) 10 MG tablet Start 60 mg po day one, then 50 mg po day two, taper by 10 mg daily until complete. 21 tablet Norval Gable, MD   HYDROcodone-acetaminophen (NORCO/VICODIN) 5-325 MG tablet 1-2 tabs po bid prn 6 tablet Norval Gable, MD     1.diagnosis reviewed with patient 2. rx as per orders above; reviewed possible side effects, interactions, risks and benefits  3. Recommend supportive treatment as above 4. Follow-up prn if symptoms worsen or don't improve  Controlled  Substance Prescriptions [place] Controlled Substance Registry consulted? Yes, I have consulted the [place] Controlled Substances Registry for this patient, and feel the risk/benefit ratio today is favorable for proceeding with this prescription for a controlled substance.   Norval Gable, MD 12/21/18 1302

## 2019-04-15 ENCOUNTER — Other Ambulatory Visit: Payer: Self-pay | Admitting: Orthopedic Surgery

## 2019-04-15 DIAGNOSIS — M25511 Pain in right shoulder: Secondary | ICD-10-CM

## 2019-04-22 ENCOUNTER — Ambulatory Visit
Admission: RE | Admit: 2019-04-22 | Discharge: 2019-04-22 | Disposition: A | Discharge status: Home / Self Care | Payer: Medicare Other | Source: Ambulatory Visit | Attending: Orthopedic Surgery | Admitting: Orthopedic Surgery

## 2019-04-22 ENCOUNTER — Other Ambulatory Visit: Payer: Self-pay

## 2019-04-22 DIAGNOSIS — M25511 Pain in right shoulder: Secondary | ICD-10-CM | POA: Diagnosis present

## 2019-04-23 ENCOUNTER — Ambulatory Visit: Payer: Medicare Other

## 2019-06-20 DIAGNOSIS — E785 Hyperlipidemia, unspecified: Secondary | ICD-10-CM | POA: Insufficient documentation

## 2019-10-05 ENCOUNTER — Other Ambulatory Visit: Payer: Self-pay | Admitting: Obstetrics and Gynecology

## 2019-10-05 DIAGNOSIS — Z1231 Encounter for screening mammogram for malignant neoplasm of breast: Secondary | ICD-10-CM

## 2019-11-09 DIAGNOSIS — E039 Hypothyroidism, unspecified: Secondary | ICD-10-CM | POA: Insufficient documentation

## 2019-11-15 ENCOUNTER — Other Ambulatory Visit: Payer: Self-pay | Admitting: Orthopedic Surgery

## 2019-11-22 ENCOUNTER — Other Ambulatory Visit (INDEPENDENT_AMBULATORY_CARE_PROVIDER_SITE_OTHER): Payer: Self-pay

## 2019-11-22 ENCOUNTER — Telehealth (INDEPENDENT_AMBULATORY_CARE_PROVIDER_SITE_OTHER): Payer: Self-pay | Admitting: Gastroenterology

## 2019-11-22 DIAGNOSIS — R102 Pelvic and perineal pain unspecified side: Secondary | ICD-10-CM | POA: Insufficient documentation

## 2019-11-22 DIAGNOSIS — Z86010 Personal history of colonic polyps: Secondary | ICD-10-CM

## 2019-11-22 DIAGNOSIS — Z8701 Personal history of pneumonia (recurrent): Secondary | ICD-10-CM | POA: Insufficient documentation

## 2019-11-22 DIAGNOSIS — F5104 Psychophysiologic insomnia: Secondary | ICD-10-CM | POA: Insufficient documentation

## 2019-11-22 MED ORDER — NA SULFATE-K SULFATE-MG SULF 17.5-3.13-1.6 GM/177ML PO SOLN: 1.0000 | Freq: Once | ORAL | 0 refills | Status: AC

## 2019-11-22 NOTE — Progress Notes (Signed)
Gastroenterology Pre-Procedure Review  Request Date: Friday 12/17/19 Requesting Physician: Dr. Allen Norris  PATIENT REVIEW QUESTIONS: The patient responded to the following health history questions as indicated:    1. Are you having any GI issues? no 2. Do you have a personal history of Polyps? yes (09/17/16 colonoscopy performed by Dr. Allen Norris.  Colon Polyps noted.) 3. Do you have a family history of Colon Cancer or Polyps? yes (brother colon polyps) 4. Diabetes Mellitus? no 5. Joint replacements in the past 12 months?no 6. Major health problems in the past 3 months?no 7. Any artificial heart valves, MVP, or defibrillator?no    MEDICATIONS & ALLERGIES:    Patient reports the following regarding taking any anticoagulation/antiplatelet therapy:   Plavix, Coumadin, Eliquis, Xarelto, Lovenox, Pradaxa, Brilinta, or Effient? no Aspirin? no  Patient confirms/reports the following medications:  Current Outpatient Medications  Medication Sig Dispense Refill   buPROPion (WELLBUTRIN XL) 300 MG 24 hr tablet      butalbital-aspirin-caffeine (FIORINAL) 50-325-40 MG capsule Take by mouth.     Calcium Carb-Cholecalciferol (CALCIUM-VITAMIN D) 500-200 MG-UNIT tablet Take by mouth.     DULoxetine (CYMBALTA) 30 MG capsule Take by mouth.     fluticasone (FLONASE) 50 MCG/ACT nasal spray SHAKE LIQUID AND USE 2 SPRAYS IN EACH NOSTRIL EVERY DAY AS NEEDED FOR RHINITIS OR ALLERGIES     levothyroxine (SYNTHROID, LEVOTHROID) 50 MCG tablet Take 50 mcg by mouth daily before breakfast.     Multiple Vitamin (MULTI-VITAMINS) TABS Take by mouth.     omeprazole (PRILOSEC) 10 MG capsule Take by mouth.     raloxifene (EVISTA) 60 MG tablet Take by mouth.     simvastatin (ZOCOR) 20 MG tablet Take by mouth.     solifenacin (VESICARE) 10 MG tablet Take by mouth.     traZODone (DESYREL) 50 MG tablet      estradiol (ESTRACE) 0.1 MG/GM vaginal cream Place vaginally. (Patient not taking: Reported on 11/22/2019)     Na  Sulfate-K Sulfate-Mg Sulf 17.5-3.13-1.6 GM/177ML SOLN Take 1 kit by mouth once for 1 dose. 354 mL 0   No current facility-administered medications for this visit.    Patient confirms/reports the following allergies:  Allergies  Allergen Reactions   Morphine Nausea Only   Other Anaphylaxis    IVP contrast dye   Penicillin G Anaphylaxis   Iodinated Diagnostic Agents Swelling   Shellfish Allergy Swelling   Codeine Nausea Only    No orders of the defined types were placed in this encounter.   AUTHORIZATION INFORMATION Primary Insurance: 1D#: Group #:  Secondary Insurance: 1D#: Group #:  SCHEDULE INFORMATION: Date: 12/17/22 Time: Location:[facility]

## 2019-11-29 ENCOUNTER — Encounter
Admission: RE | Admit: 2019-11-29 | Discharge: 2019-11-29 | Disposition: A | Discharge status: Home / Self Care | Payer: Medicare Other | Source: Ambulatory Visit | Attending: Orthopedic Surgery | Admitting: Orthopedic Surgery

## 2019-11-29 ENCOUNTER — Other Ambulatory Visit: Payer: Self-pay

## 2019-11-29 HISTORY — DX: Personal history of urinary calculi: Z87.442

## 2019-11-29 NOTE — Patient Instructions (Addendum)
Moon I forgot to tell you about the Ensure "clear" Pre Surgery Drink. It needs to be consumed 2 hours before arriving to surgery. I've also added instructions on how to use an Chiropodist. This will help with taking deep breaths after surgery, also reduceing the risk of developing pneumonia. (That's what happens when your having too much fun during your telephone interview" ;) Good luck with surgery  Your procedure is scheduled on: 12/06/19 Report to [place]. To find out your arrival time please call [phone] between 1PM - 3PM on 12/03/19.  Remember: Instructions that are not followed completely may result in serious medical risk, up to and including death, or upon the discretion of your surgeon and anesthesiologist your surgery may need to be rescheduled.     _X__ 1. Do not eat food after midnight the night before your procedure.                 No gum chewing or hard candies. You may drink clear liquids up to 2 hours                 before you are scheduled to arrive for your surgery- DO not drink clear                 liquids within 2 hours of the start of your surgery.                 Clear Liquids include:  water, apple juice without pulp, clear carbohydrate                 drink such as Clearfast or Gatorade, Black Coffee or Tea (Do not add                 anything to coffee or tea). Diabetics water only  __X__2.  On the morning of surgery brush your teeth with toothpaste and water, you                 may rinse your mouth with mouthwash if you wish.  Do not swallow any              toothpaste of mouthwash.     _X__ 3.  No Alcohol for 24 hours before or after surgery.   _X__ 4.  Do Not Smoke or use e-cigarettes For 24 Hours Prior to Your Surgery.                 Do not use any chewable tobacco products for at least 6 hours prior to                 surgery.  ____  5.  Bring all medications with you on the day of  surgery if instructed.   __X__  6.  Notify your doctor if there is any change in your medical condition      (cold, fever, infections).     Do not wear jewelry, make-up, hairpins, clips or nail polish. Do not wear lotions, powders, or perfumes.  Do not shave 48 hours prior to surgery. Men may shave face and neck. Do not bring valuables to the hospital.    [facility] is not responsible for any belongings or valuables.  Contacts, dentures/partials or body piercings may not be worn into surgery. Bring a case for your contacts, glasses or hearing aids, a denture cup will be supplied. Leave your suitcase in the car. After  surgery it may be brought to your room. For patients admitted to the hospital, discharge time is determined by your treatment team.   Patients discharged the day of surgery will not be allowed to drive home.   Please read over the following fact sheets that you were given:   MRSA Information  __X__ Take these medicines the morning of surgery with A SIP OF WATER:    1. buPROPion (WELLBUTRIN XL) 300 MG 24 hr tablet  2. levothyroxine (SYNTHROID, LEVOTHROID) 50 MCG tablet  3. omeprazole (PRILOSEC) 20 MG capsule  4. solifenacin (VESICARE) 10 MG tablet  5.  6.  ____ Fleet Enema (as directed)   __X__ Use CHG Soap/SAGE wipes as directed  ____ Use inhalers on the day of surgery  ____ Stop metformin/Janumet/Farxiga 2 days prior to surgery    ____ Take 1/2 of usual insulin dose the night before surgery. No insulin the morning          of surgery.   ____ Stop Blood Thinners Coumadin/Plavix/Xarelto/Pleta/Pradaxa/Eliquis/Effient/Aspirin  on   Or contact your Surgeon, Cardiologist or Medical Doctor regarding  ability to stop your blood thinners  __X__ Stop Anti-inflammatories 7 days before surgery such as Advil, Ibuprofen, Motrin,  BC or Goodies Powder, Naprosyn, Naproxen, Aleve, Aspirin    __X__ Stop all herbal supplements, fish oil or vitamin E until after surgery.     ____ Bring C-Pap to the hospital.      How to Use Chlorhexidine for Bathing Chlorhexidine gluconate (CHG) is a germ-killing (antiseptic) solution that is used to clean the skin. It can get rid of the bacteria that normally live on the skin and can keep them away for about 24 hours. To clean your skin with CHG, you may be given:  A CHG solution to use in the shower or as part of a sponge bath.  A prepackaged cloth that contains CHG. Cleaning your skin with CHG may help lower the risk for infection:  While you are staying in the intensive care unit of the hospital.  If you have a vascular access, such as a central line, to provide short-term or long-term access to your veins.  If you have a catheter to drain urine from your bladder.  If you are on a ventilator. A ventilator is a machine that helps you breathe by moving air in and out of your lungs.  After surgery. What are the risks? Risks of using CHG include:  A skin reaction.  Hearing loss, if CHG gets in your ears.  Eye injury, if CHG gets in your eyes and is not rinsed out.  The CHG product catching fire. Make sure that you avoid smoking and flames after applying CHG to your skin. Do not use CHG:  If you have a chlorhexidine allergy or have previously reacted to chlorhexidine.  On babies younger than 56 months of age. How to use CHG solution  Use CHG only as told by your health care provider, and follow the instructions on the label.  Use the full amount of CHG as directed. Usually, this is one bottle. During a shower Follow these steps when using CHG solution during a shower (unless your health care provider gives you different instructions): 1. Start the shower. 2. Use your normal soap and shampoo to wash your face and hair. 3. Turn off the shower or move out of the shower stream. 4. Pour the CHG onto a clean washcloth. Do not use any type of brush or rough-edged sponge. 5. Starting  at your neck, lather your  body down to your toes. Make sure you follow these instructions: ? If you will be having surgery, pay special attention to the part of your body where you will be having surgery. Scrub this area for at least 1 minute. ? Do not use CHG on your head or face. If the solution gets into your ears or eyes, rinse them well with water. ? Avoid your genital area. ? Avoid any areas of skin that have broken skin, cuts, or scrapes. ? Scrub your back and under your arms. Make sure to wash skin folds. 6. Let the lather sit on your skin for 1-2 minutes or as long as told by your health care provider. 7. Thoroughly rinse your entire body in the shower. Make sure that all body creases and crevices are rinsed well. 8. Dry off with a clean towel. Do not put any substances on your body afterward--such as powder, lotion, or perfume--unless you are told to do so by your health care provider. Only use lotions that are recommended by the manufacturer. 9. Put on clean clothes or pajamas. 10. If it is the night before your surgery, sleep in clean sheets. 11. Repeat the morning of surgery      Interscalene Nerve Block with Exparel  1.  For your surgery you have received an Interscalene Nerve Block with Exparel. 2. Nerve Blocks affect many types of nerves, including nerves that control movement, pain and normal sensation.  You may experience feelings such as numbness, tingling, heaviness, weakness or the inability to move your arm or the feeling or sensation that your arm has "fallen asleep". 3. A nerve block with Exparel can last up to 5 days.  Usually the weakness wears off first.  The tingling and heaviness usually wear off next.  Finally you may start to notice pain.  Keep in mind that this may occur in any order.  Once a nerve block starts to wear off it is usually completely gone within 60 minutes. 4. ISNB may cause mild shortness of breath, a hoarse voice, blurry vision, unequal pupils, or drooping of the face on the  same side as the nerve block.  These symptoms will usually resolve with the numbness.  Very rarely the procedure itself can cause mild seizures. 5. If needed, your surgeon will give you a prescription for pain medication.  It will take about 60 minutes for the oral pain medication to become fully effective.  So, it is recommended that you start taking this medication before the nerve block first begins to wear off, or when you first begin to feel discomfort. 6. Take your pain medication only as prescribed.  Pain medication can cause sedation and decrease your breathing if you take more than you need for the level of pain that you have. 7. Nausea is a common side effect of many pain medications.  You may want to eat something before taking your pain medicine to prevent nausea. 8. After an Interscalene nerve block, you cannot feel pain, pressure or extremes in temperature in the effected arm.  Because your arm is numb it is at an increased risk for injury.  To decrease the possibility of injury, please practice the following:  a. While you are awake change the position of your arm frequently to prevent too much pressure on any one area for prolonged periods of time. b.  If you have a cast or tight dressing, check the color or your fingers every couple of  hours.  Call your surgeon with the appearance of any discoloration (white or blue). c. If you are given a sling to wear before you go home, please wear it  at all times until the block has completely worn off.  Do not get up at night without your sling. d. Please contact [facility] or your surgeon if you do not begin to regain sensation after 7 days from the surgery.  Anesthesia may be contacted by calling the Same Day Surgery Department, Mon. through Fri., 6 am to 4 pm at [phone].   e. If you experience any other problems or concerns, please contact your surgeon's office. f. If you experience severe or prolonged shortness of breath go to the  nearest emergency department.   How to Use an Incentive Spirometer  An incentive spirometer is a tool that measures how well you are filling your lungs with each breath. Learning to take long, deep breaths using this tool can help you keep your lungs clear and active. This may help to reverse or lessen your chance of developing breathing (pulmonary) problems, especially infection. You may be asked to use a spirometer:  After a surgery.  If you have a lung problem or a history of smoking.  After a long period of time when you have been unable to move or be active. If the spirometer includes an indicator to show the highest number that you have reached, your health care provider or respiratory therapist will help you set a goal. Keep a list (log) of your progress as told by your health care provider. What are the risks?  Breathing too quickly may cause dizziness or cause you to pass out. Take your time so you do not get dizzy or light-headed.  If you are in pain, you may need to take pain medicine before doing incentive spirometry. It is harder to take a deep breath if you are having pain. How to use your incentive spirometer  1. Sit up on the edge of your bed or on a chair. 2. Hold the incentive spirometer so that it is in an upright position. 3. Before you use the spirometer, breathe out normally. 4. Place the mouthpiece in your mouth. Make sure your lips are closed tightly around it. 5. Breathe in slowly and as deeply as you can through your mouth, causing the piston or the ball to rise toward the top of the chamber. 6. Hold your breath for 3-5 seconds, or for as long as possible. ? If the spirometer includes a coach indicator, use this to guide you in breathing. Slow down your breathing if the indicator goes above the marked areas. 7. Remove the mouthpiece from your mouth and breathe out normally. The piston or ball will return to the bottom of the chamber. 8. Rest for a few seconds,  then repeat the steps 10 or more times. ? Take your time and take a few normal breaths between deep breaths so that you do not get dizzy or light-headed. ? Do this every 1-2 hours when you are awake. 9. If the spirometer includes a goal marker to show the highest number you have reached (best effort), use this as a goal to work toward during each repetition. 10. After each set of 10 deep breaths, cough a few times. This will help to make sure that your lungs are clear. ? If you have an incision on your chest or abdomen from surgery, place a pillow or a rolled-up towel firmly against the  incision when you cough. This can help to reduce pain from coughing. General tips  When you become able to get out of bed, walk around often and continue to cough to help clear your lungs.  Keep using the incentive spirometer until your health care provider says it is okay to stop using it. If you have been in the hospital, you may be told to keep using the spirometer at home. Contact a health care provider if:  You are having difficulty using the spirometer.  You have trouble using the spirometer as often as instructed.  Your pain medicine is not giving enough relief for you to use the spirometer as told.  You have a fever.  You develop shortness of breath. Get help right away if:  You develop a cough with bloody mucus from the lungs (bloody sputum).  You have fluid or blood coming from an incision site after you cough. Summary  An incentive spirometer is a tool that can help you learn to take long, deep breaths to keep your lungs clear and active.  You may be asked to use a spirometer after a surgery, if you have a lung problem or a history of smoking, or if you have been inactive for a long period of time.  Use your incentive spirometer as instructed every 1-2 hours while you are awake.  If you have an incision on your chest or abdomen, place a pillow or a rolled-up towel firmly against your  incision when you cough. This will help to reduce pain. This information is not intended to replace advice given to you by your health care provider. Make sure you discuss any questions you have with your health care provider. Document Revised: 10/23/2018 Document Reviewed: 02/05/2017 Elsevier Patient Education  2020 [employer].

## 2019-11-30 ENCOUNTER — Encounter
Admission: RE | Admit: 2019-11-30 | Discharge: 2019-11-30 | Disposition: A | Discharge status: Home / Self Care | Payer: Medicare Other | Source: Ambulatory Visit | Attending: Orthopedic Surgery | Admitting: Orthopedic Surgery

## 2019-11-30 DIAGNOSIS — Z01810 Encounter for preprocedural cardiovascular examination: Secondary | ICD-10-CM | POA: Insufficient documentation

## 2019-11-30 DIAGNOSIS — Z01812 Encounter for preprocedural laboratory examination: Secondary | ICD-10-CM | POA: Diagnosis present

## 2019-12-02 ENCOUNTER — Other Ambulatory Visit
Admission: RE | Admit: 2019-12-02 | Discharge: 2019-12-02 | Disposition: A | Discharge status: Home / Self Care | Payer: Medicare Other | Source: Ambulatory Visit | Attending: Orthopedic Surgery | Admitting: Orthopedic Surgery

## 2019-12-02 ENCOUNTER — Other Ambulatory Visit: Payer: Self-pay

## 2019-12-02 DIAGNOSIS — Z20822 Contact with and (suspected) exposure to covid-19: Secondary | ICD-10-CM | POA: Diagnosis not present

## 2019-12-02 DIAGNOSIS — Z01812 Encounter for preprocedural laboratory examination: Secondary | ICD-10-CM | POA: Insufficient documentation

## 2019-12-02 LAB — SARS CORONAVIRUS 2 (TAT 6-24 HRS): SARS Coronavirus 2: NEGATIVE

## 2019-12-03 MED ORDER — PROPOFOL 500 MG/50ML IV EMUL
INTRAVENOUS | Status: AC
  Filled 2019-12-03: qty 500

## 2019-12-06 ENCOUNTER — Encounter: Payer: Self-pay | Admitting: Orthopedic Surgery

## 2019-12-06 ENCOUNTER — Other Ambulatory Visit: Payer: Self-pay

## 2019-12-06 ENCOUNTER — Ambulatory Visit: Payer: Medicare Other | Admitting: Anesthesiology

## 2019-12-06 ENCOUNTER — Encounter
Admission: RE | Disposition: A | Discharge status: Home / Self Care | Payer: Self-pay | Source: Home / Self Care | Attending: Orthopedic Surgery

## 2019-12-06 ENCOUNTER — Ambulatory Visit: Payer: Medicare Other | Admitting: Urgent Care

## 2019-12-06 ENCOUNTER — Ambulatory Visit
Admission: RE | Admit: 2019-12-06 | Discharge: 2019-12-06 | Disposition: A | Discharge status: Home / Self Care | Payer: Medicare Other | Attending: Orthopedic Surgery | Admitting: Orthopedic Surgery

## 2019-12-06 ENCOUNTER — Ambulatory Visit: Payer: Medicare Other

## 2019-12-06 DIAGNOSIS — G43909 Migraine, unspecified, not intractable, without status migrainosus: Secondary | ICD-10-CM | POA: Insufficient documentation

## 2019-12-06 DIAGNOSIS — M1990 Unspecified osteoarthritis, unspecified site: Secondary | ICD-10-CM | POA: Diagnosis not present

## 2019-12-06 DIAGNOSIS — E785 Hyperlipidemia, unspecified: Secondary | ICD-10-CM | POA: Insufficient documentation

## 2019-12-06 DIAGNOSIS — Z6829 Body mass index (BMI) 29.0-29.9, adult: Secondary | ICD-10-CM | POA: Insufficient documentation

## 2019-12-06 DIAGNOSIS — E669 Obesity, unspecified: Secondary | ICD-10-CM | POA: Insufficient documentation

## 2019-12-06 DIAGNOSIS — E039 Hypothyroidism, unspecified: Secondary | ICD-10-CM | POA: Insufficient documentation

## 2019-12-06 DIAGNOSIS — M75121 Complete rotator cuff tear or rupture of right shoulder, not specified as traumatic: Secondary | ICD-10-CM | POA: Insufficient documentation

## 2019-12-06 DIAGNOSIS — E119 Type 2 diabetes mellitus without complications: Secondary | ICD-10-CM | POA: Insufficient documentation

## 2019-12-06 DIAGNOSIS — K219 Gastro-esophageal reflux disease without esophagitis: Secondary | ICD-10-CM | POA: Diagnosis not present

## 2019-12-06 DIAGNOSIS — M8580 Other specified disorders of bone density and structure, unspecified site: Secondary | ICD-10-CM | POA: Insufficient documentation

## 2019-12-06 DIAGNOSIS — M25811 Other specified joint disorders, right shoulder: Secondary | ICD-10-CM | POA: Diagnosis not present

## 2019-12-06 DIAGNOSIS — Z419 Encounter for procedure for purposes other than remedying health state, unspecified: Secondary | ICD-10-CM

## 2019-12-06 DIAGNOSIS — M7551 Bursitis of right shoulder: Secondary | ICD-10-CM | POA: Diagnosis not present

## 2019-12-06 HISTORY — PX: SHOULDER ARTHROSCOPY WITH SUBACROMIAL DECOMPRESSION AND OPEN ROTATOR C: SHX5688

## 2019-12-06 SURGERY — SHOULDER ARTHROSCOPY WITH SUBACROMIAL DECOMPRESSION AND OPEN ROTATOR CUFF REPAIR, OPEN BICEPS TENDON REPAIR
Anesthesia: General | Laterality: Right

## 2019-12-06 MED ORDER — LACTATED RINGERS IV SOLN
INTRAVENOUS | Status: DC | PRN
  Administered 2019-12-06: 4 mL

## 2019-12-06 MED ORDER — DEXAMETHASONE SODIUM PHOSPHATE 10 MG/ML IJ SOLN
INTRAMUSCULAR | Status: DC | PRN
  Administered 2019-12-06: 10 mg via INTRAVENOUS

## 2019-12-06 MED ORDER — SUGAMMADEX SODIUM 200 MG/2ML IV SOLN
INTRAVENOUS | Status: DC | PRN
  Administered 2019-12-06: 200 mg via INTRAVENOUS

## 2019-12-06 MED ORDER — LIDOCAINE HCL (PF) 1 % IJ SOLN
INTRAMUSCULAR | Status: AC
  Filled 2019-12-06: qty 5

## 2019-12-06 MED ORDER — FENTANYL CITRATE (PF) 100 MCG/2ML IJ SOLN: 25.0000 ug | INTRAMUSCULAR | Status: DC | PRN

## 2019-12-06 MED ORDER — MIDAZOLAM HCL 2 MG/2ML IJ SOLN
INTRAMUSCULAR | Status: DC | PRN
  Administered 2019-12-06: 1 mg via INTRAVENOUS

## 2019-12-06 MED ORDER — ROCURONIUM BROMIDE 100 MG/10ML IV SOLN
INTRAVENOUS | Status: DC | PRN
  Administered 2019-12-06: 40 mg via INTRAVENOUS

## 2019-12-06 MED ORDER — PROPOFOL 500 MG/50ML IV EMUL
INTRAVENOUS | Status: DC | PRN
  Administered 2019-12-06: 50 ug/kg/min via INTRAVENOUS

## 2019-12-06 MED ORDER — MIDAZOLAM HCL 2 MG/2ML IJ SOLN: 1.0000 mg | INTRAMUSCULAR | Status: DC | PRN

## 2019-12-06 MED ORDER — ASPIRIN EC 325 MG PO TBEC: 325.0000 mg | DELAYED_RELEASE_TABLET | Freq: Every day | ORAL | 0 refills | Status: AC

## 2019-12-06 MED ORDER — BUPIVACAINE HCL (PF) 0.5 % IJ SOLN
INTRAMUSCULAR | Status: AC
  Filled 2019-12-06: qty 10

## 2019-12-06 MED ORDER — PROPOFOL 10 MG/ML IV BOLUS
INTRAVENOUS | Status: AC
  Filled 2019-12-06: qty 20

## 2019-12-06 MED ORDER — ACETAMINOPHEN 10 MG/ML IV SOLN
INTRAVENOUS | Status: AC
  Filled 2019-12-06: qty 100

## 2019-12-06 MED ORDER — FENTANYL CITRATE (PF) 100 MCG/2ML IJ SOLN: 50.0000 ug | INTRAMUSCULAR | Status: DC | PRN

## 2019-12-06 MED ORDER — DEXAMETHASONE SODIUM PHOSPHATE 10 MG/ML IJ SOLN
INTRAMUSCULAR | Status: AC
  Filled 2019-12-06: qty 1

## 2019-12-06 MED ORDER — ONDANSETRON HCL 4 MG/2ML IJ SOLN
INTRAMUSCULAR | Status: AC
  Filled 2019-12-06: qty 2

## 2019-12-06 MED ORDER — BUPIVACAINE LIPOSOME 1.3 % IJ SUSP
INTRAMUSCULAR | Status: DC | PRN
  Administered 2019-12-06: 13 mL via PERINEURAL
  Administered 2019-12-06: 7 mL via PERINEURAL

## 2019-12-06 MED ORDER — ACETAMINOPHEN 500 MG PO TABS: 1000.0000 mg | ORAL_TABLET | Freq: Three times a day (TID) | ORAL | 2 refills | Status: AC

## 2019-12-06 MED ORDER — BUPIVACAINE HCL (PF) 0.25 % IJ SOLN
INTRAMUSCULAR | Status: AC
  Filled 2019-12-06: qty 30

## 2019-12-06 MED ORDER — FENTANYL CITRATE (PF) 100 MCG/2ML IJ SOLN
INTRAMUSCULAR | Status: AC
  Administered 2019-12-06: 50 ug via INTRAVENOUS
  Filled 2019-12-06: qty 2

## 2019-12-06 MED ORDER — CLINDAMYCIN PHOSPHATE 900 MG/50ML IV SOLN
900.0000 mg | INTRAVENOUS | Status: AC
  Administered 2019-12-06: 900 mg via INTRAVENOUS

## 2019-12-06 MED ORDER — LACTATED RINGERS IV SOLN: INTRAVENOUS | Status: DC

## 2019-12-06 MED ORDER — EPHEDRINE SULFATE 50 MG/ML IJ SOLN
INTRAMUSCULAR | Status: DC | PRN
  Administered 2019-12-06: 5 mg via INTRAVENOUS
  Administered 2019-12-06: 10 mg via INTRAVENOUS
  Administered 2019-12-06 (×4): 5 mg via INTRAVENOUS
  Administered 2019-12-06: 10 mg via INTRAVENOUS

## 2019-12-06 MED ORDER — ORAL CARE MOUTH RINSE: 15.0000 mL | Freq: Once | OROMUCOSAL | Status: AC

## 2019-12-06 MED ORDER — ONDANSETRON HCL 4 MG/2ML IJ SOLN
INTRAMUSCULAR | Status: DC | PRN
  Administered 2019-12-06: 4 mg via INTRAVENOUS

## 2019-12-06 MED ORDER — PHENYLEPHRINE HCL (PRESSORS) 10 MG/ML IV SOLN
INTRAVENOUS | Status: DC | PRN
  Administered 2019-12-06 (×8): 100 ug via INTRAVENOUS

## 2019-12-06 MED ORDER — ONDANSETRON HCL 4 MG/2ML IJ SOLN: 4.0000 mg | Freq: Once | INTRAMUSCULAR | Status: DC | PRN

## 2019-12-06 MED ORDER — BUPIVACAINE HCL (PF) 0.5 % IJ SOLN
INTRAMUSCULAR | Status: DC | PRN
  Administered 2019-12-06: 7 mL via PERINEURAL
  Administered 2019-12-06: 3 mL via PERINEURAL

## 2019-12-06 MED ORDER — EPINEPHRINE PF 1 MG/ML IJ SOLN
INTRAMUSCULAR | Status: AC
  Filled 2019-12-06: qty 4

## 2019-12-06 MED ORDER — FENTANYL CITRATE (PF) 100 MCG/2ML IJ SOLN
INTRAMUSCULAR | Status: DC | PRN
Start: 2019-12-06 — End: 2019-12-06
  Administered 2019-12-06 (×2): 25 ug via INTRAVENOUS
  Administered 2019-12-06: 50 ug via INTRAVENOUS

## 2019-12-06 MED ORDER — BUPIVACAINE LIPOSOME 1.3 % IJ SUSP
INTRAMUSCULAR | Status: AC
  Filled 2019-12-06: qty 20

## 2019-12-06 MED ORDER — LIDOCAINE HCL (CARDIAC) PF 100 MG/5ML IV SOSY
PREFILLED_SYRINGE | INTRAVENOUS | Status: DC | PRN
  Administered 2019-12-06: 80 mg via INTRAVENOUS

## 2019-12-06 MED ORDER — PROPOFOL 500 MG/50ML IV EMUL
INTRAVENOUS | Status: AC
  Filled 2019-12-06: qty 50

## 2019-12-06 MED ORDER — EPHEDRINE 5 MG/ML INJ
INTRAVENOUS | Status: AC
  Filled 2019-12-06: qty 10

## 2019-12-06 MED ORDER — ONDANSETRON 4 MG PO TBDP: 4.0000 mg | ORAL_TABLET | Freq: Three times a day (TID) | ORAL | 0 refills | Status: DC | PRN

## 2019-12-06 MED ORDER — ACETAMINOPHEN 10 MG/ML IV SOLN
INTRAVENOUS | Status: DC | PRN
  Administered 2019-12-06: 1000 mg via INTRAVENOUS

## 2019-12-06 MED ORDER — CHLORHEXIDINE GLUCONATE 0.12 % MT SOLN
OROMUCOSAL | Status: AC
  Administered 2019-12-06: 15 mL via OROMUCOSAL
  Filled 2019-12-06: qty 15

## 2019-12-06 MED ORDER — CHLORHEXIDINE GLUCONATE 0.12 % MT SOLN: 15.0000 mL | Freq: Once | OROMUCOSAL | Status: AC

## 2019-12-06 MED ORDER — OXYCODONE HCL 5 MG PO TABS
5.0000 mg | ORAL_TABLET | ORAL | 0 refills | Status: DC | PRN
Start: 2019-12-06 — End: 2020-08-23

## 2019-12-06 MED ORDER — CLINDAMYCIN PHOSPHATE 900 MG/50ML IV SOLN
INTRAVENOUS | Status: AC
  Filled 2019-12-06: qty 50

## 2019-12-06 MED ORDER — MIDAZOLAM HCL 2 MG/2ML IJ SOLN
INTRAMUSCULAR | Status: AC
  Administered 2019-12-06: 1 mg via INTRAVENOUS
  Filled 2019-12-06: qty 2

## 2019-12-06 MED ORDER — PROPOFOL 10 MG/ML IV BOLUS
INTRAVENOUS | Status: DC | PRN
  Administered 2019-12-06: 150 mg via INTRAVENOUS
  Administered 2019-12-06: 50 mg via INTRAVENOUS

## 2019-12-06 MED ORDER — SCOPOLAMINE 1 MG/3DAYS TD PT72
1.0000 | MEDICATED_PATCH | TRANSDERMAL | Status: DC
  Administered 2019-12-05: 1.5 mg via TRANSDERMAL

## 2019-12-06 MED ORDER — FENTANYL CITRATE (PF) 100 MCG/2ML IJ SOLN
INTRAMUSCULAR | Status: AC
  Filled 2019-12-06: qty 2

## 2019-12-06 MED ORDER — MIDAZOLAM HCL 2 MG/2ML IJ SOLN
INTRAMUSCULAR | Status: AC
  Filled 2019-12-06: qty 2

## 2019-12-06 SURGICAL SUPPLY — 77 items
ADAPTER IRRIG TUBE 2 SPIKE SOL (ADAPTER) ×4 IMPLANT
ADH SKN CLS APL DERMABOND .7 (GAUZE/BANDAGES/DRESSINGS)
ADPR TBG 2 SPK PMP STRL ASCP (ADAPTER) ×2
ANCH SUT SWLK 19.1X4.75 (Anchor) ×2 IMPLANT
ANCHOR SUT BIO SW 4.75X19.1 (Anchor) ×4 IMPLANT
APL PRP STRL LF DISP 70% ISPRP (MISCELLANEOUS) ×1
BNDG ADH 2 X3.75 FABRIC TAN LF (GAUZE/BANDAGES/DRESSINGS) ×2 IMPLANT
BNDG ADH XL 3.75X2 STRCH LF (GAUZE/BANDAGES/DRESSINGS) ×1
BUR BR 5.5 12 FLUTE (BURR) ×2 IMPLANT
BUR RADIUS 4.0X18.5 (BURR) ×2 IMPLANT
CANNULA PART THRD DISP 5.75X7 (CANNULA) IMPLANT
CANNULA PARTIAL THREAD 2X7 (CANNULA) IMPLANT
CANNULA TWIST IN 8.25X9CM (CANNULA) ×2 IMPLANT
CHLORAPREP W/TINT 26 (MISCELLANEOUS) ×2 IMPLANT
COOLER POLAR GLACIER W/PUMP (MISCELLANEOUS) ×2 IMPLANT
COVER WAND RF STERILE (DRAPES) ×2 IMPLANT
DERMABOND ADVANCED (GAUZE/BANDAGES/DRESSINGS)
DERMABOND ADVANCED .7 DNX12 (GAUZE/BANDAGES/DRESSINGS) IMPLANT
DRAPE 3/4 80X56 (DRAPES) ×2 IMPLANT
DRAPE IMP U-DRAPE 54X76 (DRAPES) ×4 IMPLANT
DRAPE INCISE IOBAN 66X45 STRL (DRAPES) ×2 IMPLANT
DRAPE U-SHAPE 47X51 STRL (DRAPES) ×4 IMPLANT
DRSG TEGADERM 4X4.75 (GAUZE/BANDAGES/DRESSINGS) ×6 IMPLANT
ELECT REM PT RETURN 9FT ADLT (ELECTROSURGICAL) ×2
ELECTRODE REM PT RTRN 9FT ADLT (ELECTROSURGICAL) ×1 IMPLANT
GAUZE SPONGE 4X4 12PLY STRL (GAUZE/BANDAGES/DRESSINGS) ×2 IMPLANT
GAUZE XEROFORM 1X8 LF (GAUZE/BANDAGES/DRESSINGS) ×2 IMPLANT
GLOVE BIO SURGEON STRL SZ7.5 (GLOVE) ×2 IMPLANT
GLOVE BIOGEL PI IND STRL 8 (GLOVE) ×2 IMPLANT
GLOVE BIOGEL PI INDICATOR 8 (GLOVE) ×2
GLOVE SURG ORTHO 8.0 STRL STRW (GLOVE) ×2 IMPLANT
GLOVE SURG SYN 8.0 (GLOVE) ×2 IMPLANT
GOWN STRL REUS W/ TWL LRG LVL3 (GOWN DISPOSABLE) ×2 IMPLANT
GOWN STRL REUS W/TWL LRG LVL3 (GOWN DISPOSABLE) ×4
GOWN STRL REUS W/TWL XL LVL4 (GOWN DISPOSABLE) ×2 IMPLANT
IV LACTATED RINGER IRRG 3000ML (IV SOLUTION) ×58
IV LR IRRIG 3000ML ARTHROMATIC (IV SOLUTION) ×29 IMPLANT
KIT CORKSCREW KNTLS 3.9 S/T/P (INSTRUMENTS) IMPLANT
KIT STABILIZATION SHOULDER (MISCELLANEOUS) ×2 IMPLANT
KIT SUTURETAK 3.0 INSERT PERC (KITS) IMPLANT
KIT TURNOVER KIT A (KITS) ×2 IMPLANT
MANIFOLD NEPTUNE II (INSTRUMENTS) ×2 IMPLANT
MASK FACE SPIDER DISP (MASK) ×2 IMPLANT
MAT ABSORB  FLUID 56X50 GRAY (MISCELLANEOUS) ×2
MAT ABSORB FLUID 56X50 GRAY (MISCELLANEOUS) ×2 IMPLANT
NDL MAYO CATGUT SZ5 (NEEDLE)
NDL SAFETY ECLIPSE 18X1.5 (NEEDLE) ×1 IMPLANT
NDL SUT 5 .5 CRC TPR PNT MAYO (NEEDLE) IMPLANT
NEEDLE HYPO 18GX1.5 SHARP (NEEDLE) ×2
NEEDLE SCORPION MULTI FIRE (NEEDLE) ×2 IMPLANT
PACK SHDR ARTHRO (MISCELLANEOUS) ×2 IMPLANT
PAD ARMBOARD 7.5X6 YLW CONV (MISCELLANEOUS) ×2 IMPLANT
PAD WRAPON POLAR SHDR XLG (MISCELLANEOUS) ×1 IMPLANT
PENCIL SMOKE ULTRAEVAC 22 CON (MISCELLANEOUS) IMPLANT
SET TUBE SUCT SHAVER OUTFL 24K (TUBING) ×2 IMPLANT
SET TUBE TIP INTRA-ARTICULAR (MISCELLANEOUS) ×2 IMPLANT
SLING ULTRA II M (MISCELLANEOUS) IMPLANT
STAPLER SKIN PROX 35W (STAPLE) IMPLANT
STRAP SAFETY 5IN WIDE (MISCELLANEOUS) ×2 IMPLANT
SUT ETHILON 3-0 (SUTURE) ×2 IMPLANT
SUT LASSO 90 DEG CVD (SUTURE) IMPLANT
SUT LASSO 90 DEG SD STR (SUTURE) IMPLANT
SUT MNCRL 4-0 (SUTURE)
SUT MNCRL 4-0 27XMFL (SUTURE)
SUT PDS AB 0 CT1 27 (SUTURE) ×2 IMPLANT
SUT PROLENE 0 CT 2 (SUTURE) IMPLANT
SUT VIC AB 0 CT1 36 (SUTURE) IMPLANT
SUT VIC AB 2-0 CT2 27 (SUTURE) IMPLANT
SUTURE MNCRL 4-0 27XMF (SUTURE) IMPLANT
SUTURE TAPE 1.3 40 TPR END (SUTURE) ×2 IMPLANT
SUTURETAPE 1.3 40 TPR END (SUTURE) ×4
TAPE CLOTH 3X10 WHT NS LF (GAUZE/BANDAGES/DRESSINGS) ×2 IMPLANT
TAPE MICROFOAM 4IN (TAPE) ×2 IMPLANT
TUBING ARTHRO INFLOW-ONLY STRL (TUBING) ×2 IMPLANT
TUBING CONNECTING 10 (TUBING) IMPLANT
WAND WEREWOLF FLOW 90D (MISCELLANEOUS) ×2 IMPLANT
WRAPON POLAR PAD SHDR XLG (MISCELLANEOUS) ×2

## 2019-12-06 NOTE — Transfer of Care (Signed)
Immediate Anesthesia Transfer of Care Note  Patient: Cindy Carpenter  Procedure(s) Performed: Right shoulder arthroscopic subacromial decompression, glenohumeral debridement with possible Regeneten patch application (Right )  Patient Location: PACU  Anesthesia Type:General  Level of Consciousness: drowsy  Airway & Oxygen Therapy: Patient Spontanous Breathing and Patient connected to face mask oxygen  Post-op Assessment: Report given to RN and Post -op Vital signs reviewed and stable  Post vital signs: Reviewed and stable  Last Vitals:  Vitals Value Taken Time  BP 127/79 12/06/19 1730  Temp    Pulse 104 12/06/19 1733  Resp 20 12/06/19 1733  SpO2 98 % 12/06/19 1733  Vitals shown include unvalidated device data.  Last Pain:  Vitals:   12/06/19 1449  PainSc: 0-No pain         Complications: No complications documented.

## 2019-12-06 NOTE — Anesthesia Preprocedure Evaluation (Signed)
Anesthesia Evaluation  Patient identified by MRN, date of birth, ID band Patient awake    Reviewed: Allergy & Precautions, NPO status , Patient's Chart, lab work & pertinent test results  History of Anesthesia Complications (+) PONV and history of anesthetic complications  Airway Mallampati: II  TM Distance: >3 FB Neck ROM: Full    Dental no notable dental hx.    Pulmonary neg pulmonary ROS, neg sleep apnea, neg COPD,    breath sounds clear to auscultation- rhonchi (-) wheezing      Cardiovascular Exercise Tolerance: Good (-) hypertension(-) CAD, (-) Past MI, (-) Cardiac Stents and (-) CABG  Rhythm:Regular Rate:Normal - Systolic murmurs and - Diastolic murmurs    Neuro/Psych  Headaches, neg Seizures negative psych ROS   GI/Hepatic Neg liver ROS, GERD  ,  Endo/Other  neg diabetesHypothyroidism   Renal/GU negative Renal ROS     Musculoskeletal  (+) Arthritis ,   Abdominal (+) - obese,   Peds  Hematology negative hematology ROS (+)   Anesthesia Other Findings Past Medical History: No date: Arthritis     Comment:  hands, knees No date: Bone spur 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 No date: Family history of adverse reaction to anesthesia     Comment:  Mother - PONV No date: GERD (gastroesophageal reflux disease) No date: Headache     Comment:  migraine - couple times per year No date: Heart palpitations No date: History of kidney stones No date: Hyperlipemia No date: Insomnia No date: Osteopenia No date: PONV (postoperative nausea and vomiting) No date: Vaginal atrophy   Reproductive/Obstetrics                             Anesthesia Physical Anesthesia Plan  ASA: II  Anesthesia Plan: General   Post-op Pain Management:  Regional for Post-op pain   Induction: Intravenous  PONV Risk Score and Plan: 3 and Ondansetron, Dexamethasone, Scopolamine patch - Pre-op, Midazolam and  TIVA  Airway Management Planned: Oral ETT  Additional Equipment:   Intra-op Plan:   Post-operative Plan: Extubation in OR  Informed Consent: I have reviewed the patients History and Physical, chart, labs and discussed the procedure including the risks, benefits and alternatives for the proposed anesthesia with the patient or authorized representative who has indicated his/her understanding and acceptance.     Dental advisory given  Plan Discussed with: CRNA and Anesthesiologist  Anesthesia Plan Comments:         Anesthesia Quick Evaluation

## 2019-12-06 NOTE — Discharge Instructions (Addendum)
AMBULATORY SURGERY  DISCHARGE INSTRUCTIONS   1) The drugs that you were given will stay in your system until tomorrow so for the next 24 hours you should not:  A) Drive an automobile B) Make any legal decisions C) Drink any alcoholic beverage   2) You may resume regular meals tomorrow.  Today it is better to start with liquids and gradually work up to solid foods.  You may eat anything you prefer, but it is better to start with liquids, then soup and crackers, and gradually work up to solid foods.   3) Please notify your doctor immediately if you have any unusual bleeding, trouble breathing, redness and pain at the surgery site, drainage, fever, or pain not relieved by medication. 4)   5) Your post-operative visit with Dr.                                     is: Date:                        Time:    Please call to schedule your post-operative visit.  6) Additional Instructions:      Post-Op Instructions - Rotator Cuff Repair  1. Bracing: You will wear a shoulder immobilizer or sling for 4 weeks.   2. Driving: No driving for at least 2 weeks post-op. When driving, do not wear the immobilizer. Ideally, we recommend no driving for 4 weeks while sling is in place as one arm will be immobilized.   3. Activity: No active lifting for 2 months. Wrist, hand, and elbow motion only. Avoid lifting the upper arm away from the body except for hygiene. You are permitted to bend and straighten the elbow passively only (no active elbow motion). You may use your hand and wrist for typing, writing, and managing utensils (cutting food). Do not lift more than a coffee cup for 8 weeks.  When sleeping or resting, inclined positions (recliner chair or wedge pillow) and a pillow under the forearm for support may provide better comfort for up to 4 weeks.  Avoid long distance travel for 4 weeks.  Return to normal activities after rotator cuff repair repair normally takes 6 months on average. If rehab  goes very well, may be able to do most activities at 4 months, except overhead or contact sports.  4. Physical Therapy: Begins 3-4 days after surgery, and proceed 1 time per week for the first 6 weeks, then 1-2 times per week from weeks 6-20 post-op.  5. Medications:  - You will be provided a prescription for narcotic pain medicine. After surgery, take 1-2 narcotic tablets every 4 hours if needed for severe pain.  - A prescription for anti-nausea medication will be provided in case the narcotic medicine causes nausea - take 1 tablet every 6 hours only if nauseated.   - Take tylenol 1000 mg (2 Extra Strength tablets or 3 regular strength) every 8 hours for pain.  May decrease or stop tylenol 5 days after surgery if you are having minimal pain. - Take ASA 325mg /day x 2 weeks to help prevent DVTs/PEs (blood clots).  - DO NOT take ANY nonsteroidal anti-inflammatory pain medications (Advil, Motrin, Ibuprofen, Aleve, Naproxen, or Naprosyn). These medicines can inhibit healing of your shoulder repair.    If you are taking prescription medication for anxiety, depression, insomnia, muscle spasm, chronic pain, or for attention deficit disorder,  you are advised that you are at a higher risk of adverse effects with use of narcotics post-op, including narcotic addiction/dependence, depressed breathing, death. If you use non-prescribed substances: alcohol, marijuana, cocaine, heroin, methamphetamines, etc., you are at a higher risk of adverse effects with use of narcotics post-op, including narcotic addiction/dependence, depressed breathing, death. You are advised that taking > 50 morphine milligram equivalents (MME) of narcotic pain medication per day results in twice the risk of overdose or death. For your prescription provided: oxycodone 5 mg - taking more than 6 tablets per day would result in > 50 morphine milligram equivalents (MME) of narcotic pain medication. Be advised that we will prescribe narcotics  short-term, for acute post-operative pain only - 3 weeks for major operations such as shoulder repair/reconstruction surgeries.     6. Post-Op Appointment:  Your first post-op appointment will be 10-14 days post-op.  7. Work or School: For most, but not all procedures, we advise staying out of work or school for at least 1 to 2 weeks in order to recover from the stress of surgery and to allow time for healing.   If you need a work or school note this can be provided.   8. Smoking: If you are a smoker, you need to refrain from smoking in the postoperative period. The nicotine in cigarettes will inhibit healing of your shoulder repair and decrease the chance of successful repair. Similarly, nicotine containing products (gum, patches) should be avoided.   Post-operative Brace: Apply and remove the brace you received as you were instructed to at the time of fitting and as described in detail as the brace's instructions for use indicate.  Wear the brace for the period of time prescribed by your physician.  The brace can be cleaned with soap and water and allowed to air dry only.  Should the brace result in increased pain, decreased feeling (numbness/tingling), increased swelling or an overall worsening of your medical condition, please contact your doctor immediately.  If an emergency situation occurs as a result of wearing the brace after normal business hours, please dial 911 and seek immediate medical attention.  Let your doctor know if you have any further questions about the brace issued to you. Refer to the shoulder sling instructions for use if you have any questions regarding the correct fit of your shoulder sling.  [facility]: [phone]  Video that illustrates how to properly use a shoulder sling: "Instructions for Proper Use of an Orthopaedic Sling" [email or web address]

## 2019-12-06 NOTE — Op Note (Signed)
SURGERY DATE: 12/06/2019    PRE-OP DIAGNOSIS:  1. Right subacromial impingement and bursitis   POST-OP DIAGNOSIS: 1. Right subacromial impingement and bursitis 2. Right full-thickness rotator cuff tear   PROCEDURES:  1. Right arthroscopic rotator cuff repair 2. Right arthroscopic extensive debridement of shoulder (glenohumeral and subacromial spaces) 3. Right arthroscopic subacromial decompression   SURGEON: Cato Mulligan, MD   ANESTHESIA: Gen with Exparil interscalene block   ESTIMATED BLOOD LOSS: 5cc   DRAINS:  none   TOTAL IV FLUIDS: per anesthesia      SPECIMENS: none   IMPLANTS:  - Arthrex 4.55mm SwiveLock x 2   OPERATIVE FINDINGS:  Examination under anesthesia: A careful examination under anesthesia was performed.  Passive range of motion was: FF: 150; ER at side: 45; ER in abduction: 90; IR in abduction: 50.  Anterior load shift: NT.  Posterior load shift: NT.  Sulcus in neutral: NT.  Sulcus in ER: NT.     Intra-operative findings: A thorough arthroscopic examination of the shoulder was performed.  The findings are: 1. Biceps tendon: not visualized intraarticularly 2. Superior labrum: Degenerative 3. Posterior labrum and capsule: normal 4. Inferior capsule and inferior recess: normal 5. Glenoid cartilage surface: Grade 1 changes with mild surface fibrillation  6. Supraspinatus attachment: full-thickness tear 7. Posterior rotator cuff attachment: normal 8. Humeral head articular cartilage: normal 9. Rotator interval: significant synovitis 10: Subscapularis tendon:  Normal 11. Anterior labrum: Degenerative 12. IGHL: normal   OPERATIVE REPORT:    Indications for procedure: Cindy Carpenter is a 64 y.o. female with an approximately 3-month history of right shoulder pain.  She has had a prior rotator cuff repair, subacromial decompression, and biceps tenotomy in December 2015 by Dr. Leanor Kail.  She did well after this procedure until recently.    Clinical exam  and MRI were suggestive of subacromial impingement and bursitis.  We did discuss that if there was other more significant rotator cuff pathology, I would address that as necessary.  After discussion of risks, benefits, and alternatives to surgery, the patient elected to proceed.    Procedure in detail:   I identified Cindy Carpenter in the pre-operative holding area.  I marked the operative shoulder with my initials. I reviewed the risks and benefits of the proposed surgical intervention, and the patient (and/or patient's guardian) wished to proceed.  Anesthesia was then performed with an Exparil interscalene block.  The patient was transferred to the operative suite and placed in the beach chair position.     SCDs were placed on the lower extremities. Appropriate IV antibiotics were administered prior to incision. The operative upper extremity was then prepped and draped in standard fashion. A time out was performed confirming the correct extremity, correct patient, and correct procedure.    I then created a standard posterior portal with an 11 blade. The glenohumeral joint was easily entered with a blunt trochar and the arthroscope introduced. The findings of diagnostic arthroscopy are described above. I debrided synovitic tissue about the rotator interval and anterior and superior labrum. I then coagulated the inflamed synovium to obtain hemostasis and reduce the risk of post-operative swelling using an Arthrocare radiofrequency device.  There was suggestion of possible full-thickness tearing when the rotator cuff was viewed from the intra-articular space.  This region was marked with a PDS suture.   Next, the arthroscope was then introduced into the subacromial space. A direct lateral portal was created with an 11-blade after spinal needle localization.  There was  severe bursitis.  An extensive subacromial bursectomy was performed using a combination of the shaver and Arthrocare wand. The entire  acromial undersurface was exposed and the CA ligament was subperiosteally elevated to expose the anterior acromial hook. A 5.72mm barrel burr was used to create a flat anterior and lateral aspect of the acromion, converting it from a Type 2 to a Type 1 acromion. Care was made to keep the deltoid fascia intact.  The rotator cuff was identified after bursa was cleared.  The region marked with the PDS suture was severely thinned and just adjacent to this, was a full-thickness tear. The oscillating shaver was used to debride the rotator cuff tendon edges.  The rotator cuff footprint was cleared of soft tissue and a burr was used to create a smooth, bleeding bed to allow for improved healing.  The rotator cuff could be easily mobilized back to its native footprint.  Using a percutaneous stab incision, an Arthrex 4.75 mm anchor double loaded with suture tape was placed just lateral to the articular margin.  This served as the medial row anchor.  On further examination of the rotator cuff there was a small longitudinal split near the musculotendinous portion of the infraspinatus/supraspinatus junction.  A free FiberWire suture was passed across this tear and tied arthroscopically to reduce this component of the tear.  Next, all 4 strands of suture tape were passed through the rotator cuff near the musculotendinous junction using a scorpion suture passer.  These were loaded into another 4.75 mm SwiveLock anchor.  This was placed approximately 1 cm distal to the lateral aspect of the greater tuberosity while holding appropriate tension.  This served as the lateral row anchor.  This allowed for excellent reapproximation and compression of the rotator cuff over its footprint. The construct was stable with external and internal rotation.          The portals were closed with 3-0 Nylon. Xeroform was applied to the incisions. A sterile dressing was applied, followed by a Polar Care sleeve and a SlingShot shoulder  immobilizer/sling. The patient was awakened from anesthesia without difficulty and was transferred to the PACU in stable condition.      COMPLICATIONS: none   DISPOSITION: plan for discharge home after recovery in PACU     POSTOPERATIVE PLAN: Remain in sling (except hygiene and elbow/wrist/hand RoM exercises as instructed by PT) x4 weeks and NWB for this time. PT to begin 3-4 days after surgery. Small/Medium rotator cuff repair rehab protocol. ASA 325mg  daily x 2 weeks for DVT ppx.

## 2019-12-06 NOTE — H&P (Signed)
Paper H&P to be scanned into permanent record. H&P reviewed. No significant changes noted.  

## 2019-12-06 NOTE — Anesthesia Procedure Notes (Signed)
Anesthesia Regional Block: Interscalene brachial plexus block   Pre-Anesthetic Checklist: ,, timeout performed, Correct Patient, Correct Site, Correct Laterality, Correct Procedure, Correct Position, site marked, Risks and benefits discussed,  Surgical consent,  Pre-op evaluation,  At surgeon's request and post-op pain management  Laterality: Upper and Right  Prep: chloraprep       Needles:  Injection technique: Single-shot  Needle Type: Stimiplex     Needle Length: 5cm  Needle Gauge: 22     Additional Needles:   Procedures:,,,, ultrasound used (permanent image in chart),,,,  Narrative:  Start time: 12/06/2019 2:28 PM End time: 12/06/2019 2:32 PM Injection made incrementally with aspirations every 5 mL.  Performed by: Personally  Anesthesiologist: Wilmont Olund, Precious Haws, MD  Additional Notes: Patient consented for risk and benefits of nerve block including but not limited to nerve damage, failed block, bleeding and infection.  Patient voiced understanding.  Functioning IV was confirmed and monitors were applied.  Timeout done prior to procedure and prior to any sedation being given to the patient.  Patient confirmed procedure site prior to any sedation given to the patient.  A 64mm 22ga Stimuplex needle was used. Sterile prep,hand hygiene and sterile gloves were used.  Minimal sedation used for procedure.  No paresthesia endorsed by patient during the procedure.  Negative aspiration and negative test dose prior to incremental administration of local anesthetic. The patient tolerated the procedure well with no immediate complications.

## 2019-12-06 NOTE — Anesthesia Procedure Notes (Signed)
Procedure Name: Intubation Date/Time: 12/06/2019 3:26 PM Performed by: Caryl Asp, CRNA Pre-anesthesia Checklist: Patient identified, Patient being monitored, Timeout performed, Emergency Drugs available and Suction available Patient Re-evaluated:Patient Re-evaluated prior to induction Oxygen Delivery Method: Circle system utilized Preoxygenation: Pre-oxygenation with 100% oxygen Induction Type: IV induction Ventilation: Mask ventilation without difficulty Laryngoscope Size: 3 and McGraph Grade View: Grade I Tube type: Oral Tube size: 7.0 mm Number of attempts: 1 Airway Equipment and Method: Stylet and Video-laryngoscopy Placement Confirmation: ETT inserted through vocal cords under direct vision,  positive ETCO2 and breath sounds checked- equal and bilateral Secured at: 20 cm Tube secured with: Tape Dental Injury: Teeth and Oropharynx as per pre-operative assessment

## 2019-12-08 ENCOUNTER — Telehealth: Payer: Self-pay | Admitting: Gastroenterology

## 2019-12-08 NOTE — Telephone Encounter (Signed)
Patient calling to cancel procedure with Dr. Allen Norris on 9.10.21 due to just having shoulder surgery. Pt states she will cb to resch when cleared from other physician.

## 2019-12-15 NOTE — Anesthesia Postprocedure Evaluation (Signed)
Anesthesia Post Note  Patient: Cindy Carpenter  Procedure(s) Performed: Right arthroscopic rotator cuff repair  Right arthroscopic extensive debridement of shoulder (glenohumeral and subacromial spaces)  Right arthroscopic subacromial decompression (Right )  Patient location during evaluation: PACU Anesthesia Type: General Level of consciousness: awake and alert Pain management: pain level controlled Vital Signs Assessment: post-procedure vital signs reviewed and stable Respiratory status: spontaneous breathing, nonlabored ventilation, respiratory function stable and patient connected to nasal cannula oxygen Cardiovascular status: blood pressure returned to baseline and stable Postop Assessment: no apparent nausea or vomiting Anesthetic complications: no   No complications documented.   Last Vitals:  Vitals:   12/06/19 1816 12/06/19 1837  BP: 128/80 133/80  Pulse: 97 93  Resp: 13 16  Temp: (!) 36.1 C   SpO2: 92% 94%    Last Pain:  Vitals:   12/07/19 0906  PainSc: 0-No pain                 Molli Barrows

## 2019-12-17 ENCOUNTER — Ambulatory Visit: Admit: 2019-12-17 | Payer: TRICARE For Life (TFL) | Admitting: Gastroenterology

## 2019-12-17 SURGERY — COLONOSCOPY WITH PROPOFOL
Anesthesia: Choice

## 2019-12-30 ENCOUNTER — Ambulatory Visit
Admission: RE | Admit: 2019-12-30 | Discharge: 2019-12-30 | Disposition: A | Discharge status: Home / Self Care | Payer: Medicare Other | Source: Ambulatory Visit | Attending: Obstetrics and Gynecology | Admitting: Obstetrics and Gynecology

## 2019-12-30 ENCOUNTER — Other Ambulatory Visit: Payer: Self-pay

## 2019-12-30 DIAGNOSIS — Z1231 Encounter for screening mammogram for malignant neoplasm of breast: Secondary | ICD-10-CM | POA: Insufficient documentation

## 2020-01-06 ENCOUNTER — Other Ambulatory Visit: Payer: Self-pay | Admitting: Obstetrics and Gynecology

## 2020-01-06 DIAGNOSIS — N6320 Unspecified lump in the left breast, unspecified quadrant: Secondary | ICD-10-CM

## 2020-01-06 DIAGNOSIS — R928 Other abnormal and inconclusive findings on diagnostic imaging of breast: Secondary | ICD-10-CM

## 2020-01-06 DIAGNOSIS — R921 Mammographic calcification found on diagnostic imaging of breast: Secondary | ICD-10-CM

## 2020-01-12 ENCOUNTER — Ambulatory Visit
Admission: RE | Admit: 2020-01-12 | Discharge: 2020-01-12 | Disposition: A | Discharge status: Home / Self Care | Payer: Medicare Other | Source: Ambulatory Visit | Attending: Obstetrics and Gynecology | Admitting: Obstetrics and Gynecology

## 2020-01-12 ENCOUNTER — Other Ambulatory Visit: Payer: Self-pay

## 2020-01-12 DIAGNOSIS — N6320 Unspecified lump in the left breast, unspecified quadrant: Secondary | ICD-10-CM

## 2020-01-12 DIAGNOSIS — R928 Other abnormal and inconclusive findings on diagnostic imaging of breast: Secondary | ICD-10-CM

## 2020-01-12 DIAGNOSIS — R921 Mammographic calcification found on diagnostic imaging of breast: Secondary | ICD-10-CM | POA: Diagnosis present

## 2020-01-17 ENCOUNTER — Other Ambulatory Visit: Payer: Self-pay | Admitting: Obstetrics and Gynecology

## 2020-01-17 DIAGNOSIS — R928 Other abnormal and inconclusive findings on diagnostic imaging of breast: Secondary | ICD-10-CM

## 2020-01-17 DIAGNOSIS — N6489 Other specified disorders of breast: Secondary | ICD-10-CM

## 2020-01-17 DIAGNOSIS — R921 Mammographic calcification found on diagnostic imaging of breast: Secondary | ICD-10-CM

## 2020-07-13 ENCOUNTER — Ambulatory Visit
Admission: RE | Admit: 2020-07-13 | Discharge: 2020-07-13 | Disposition: A | Discharge status: Home / Self Care | Payer: Medicare Other | Source: Ambulatory Visit | Attending: Obstetrics and Gynecology | Admitting: Obstetrics and Gynecology

## 2020-07-13 ENCOUNTER — Other Ambulatory Visit: Payer: Self-pay

## 2020-07-13 DIAGNOSIS — R928 Other abnormal and inconclusive findings on diagnostic imaging of breast: Secondary | ICD-10-CM

## 2020-07-13 DIAGNOSIS — N6489 Other specified disorders of breast: Secondary | ICD-10-CM

## 2020-07-13 DIAGNOSIS — R921 Mammographic calcification found on diagnostic imaging of breast: Secondary | ICD-10-CM | POA: Diagnosis present

## 2020-08-06 NOTE — Discharge Instructions (Signed)
Instructions after Total Knee Replacement   Harmonie Verrastro P. Matteson Blue, Jr., M.D.     Dept. of Orthopaedics & Sports Medicine  [facility]  [address]  [place], [place]  [address]  Phone: [phone]   Fax: [phone]    DIET: Drink plenty of non-alcoholic fluids. Resume your normal diet. Include foods high in fiber.  ACTIVITY:  You may use crutches or a walker with weight-bearing as tolerated, unless instructed otherwise. You may be weaned off of the walker or crutches by your Physical Therapist.  Do NOT place pillows under the knee. Anything placed under the knee could limit your ability to straighten the knee.   Continue doing gentle exercises. Exercising will reduce the pain and swelling, increase motion, and prevent muscle weakness.   Please continue to use the TED compression stockings for 6 weeks. You may remove the stockings at night, but should reapply them in the morning. Do not drive or operate any equipment until instructed.  WOUND CARE:  Continue to use the PolarCare or ice packs periodically to reduce pain and swelling. You may bathe or shower after the staples are removed at the first office visit following surgery.  MEDICATIONS: You may resume your regular medications. Please take the pain medication as prescribed on the medication. Do not take pain medication on an empty stomach. You have been given a prescription for a blood thinner (Lovenox or Coumadin). Please take the medication as instructed. (NOTE: After completing a 2 week course of Lovenox, take one Enteric-coated aspirin once a day. This along with elevation will help reduce the possibility of phlebitis in your operated leg.) Do not drive or drink alcoholic beverages when taking pain medications.  CALL THE OFFICE FOR: Temperature above 101 degrees Excessive bleeding or drainage on the dressing. Excessive swelling, coldness, or paleness of the toes. Persistent nausea and vomiting.  FOLLOW-UP:  You  should have an appointment to return to the office in 10-14 days after surgery. Arrangements have been made for continuation of Physical Therapy (either home therapy or outpatient therapy).   [facility] Department Directory         [email or web address]       [email or web address]          Cardiology  Appointments: [place] - [phone] [place] - [phone]  Endocrinology  Appointments: [place] - [phone] [place] - [phone]  Gastroenterology  Appointments: [place] - [phone] [place] - [phone]        General Surgery   Appointments: [place] - [phone]  Internal Medicine/Family Medicine  Appointments: [place] - [phone] [place] - [phone] [place] - [phone]  Metabolic and Weigh Loss Surgery  Appointments: [place] - [phone]        Neurology  Appointments: [place] - [phone] [place] - [phone]  Neurosurgery  Appointments: [place] - [phone]  Obstetrics & Gynecology  Appointments: [place] - [phone] [place] - [phone]        Pediatrics  Appointments: [place] - [phone] [place] - [phone]  Physiatry  Appointments: [place] -[phone]  Physical Therapy  Appointments: [place] - [phone] [place] - [phone]        Podiatry  Appointments: [place] - [phone] [place] - [phone]  Pulmonology  Appointments: [place] - [phone]  Rheumatology  Appointments: [place] - [phone]         Location: [facility]  [address] , [place]  [address]  [place] Location: [facility] [address] [place], [place]  [address]  [place] Location: [facility] [address] [place], [place]  [address]    

## 2020-08-23 ENCOUNTER — Other Ambulatory Visit: Payer: Self-pay

## 2020-08-23 ENCOUNTER — Encounter
Admission: RE | Admit: 2020-08-23 | Discharge: 2020-08-23 | Disposition: A | Discharge status: Home / Self Care | Payer: Medicare Other | Source: Ambulatory Visit | Attending: Orthopedic Surgery | Admitting: Orthopedic Surgery

## 2020-08-23 ENCOUNTER — Ambulatory Visit
Admission: RE | Admit: 2020-08-23 | Discharge: 2020-08-23 | Disposition: A | Discharge status: Home / Self Care | Payer: Medicare Other | Source: Ambulatory Visit | Attending: Urgent Care | Admitting: Urgent Care

## 2020-08-23 DIAGNOSIS — Z01810 Encounter for preprocedural cardiovascular examination: Secondary | ICD-10-CM | POA: Diagnosis not present

## 2020-08-23 DIAGNOSIS — Z01818 Encounter for other preprocedural examination: Secondary | ICD-10-CM

## 2020-08-23 DIAGNOSIS — R0781 Pleurodynia: Secondary | ICD-10-CM

## 2020-08-23 DIAGNOSIS — M8580 Other specified disorders of bone density and structure, unspecified site: Secondary | ICD-10-CM

## 2020-08-23 LAB — COMPREHENSIVE METABOLIC PANEL
ALT: 17 U/L (ref 0–44)
AST: 19 U/L (ref 15–41)
Albumin: 3.7 g/dL (ref 3.5–5.0)
Alkaline Phosphatase: 79 U/L (ref 38–126)
Anion gap: 8 (ref 5–15)
BUN: 14 mg/dL (ref 8–23)
CO2: 27 mmol/L (ref 22–32)
Calcium: 9.1 mg/dL (ref 8.9–10.3)
Chloride: 107 mmol/L (ref 98–111)
Creatinine, Ser: 0.8 mg/dL (ref 0.44–1.00)
GFR, Estimated: >60 mL/min (ref >60)
Glucose, Bld: 105 mg/dL — ABNORMAL HIGH (ref 70–99)
Potassium: 3.6 mmol/L (ref 3.5–5.1)
Sodium: 142 mmol/L (ref 135–145)
Total Bilirubin: 0.5 mg/dL (ref 0.3–1.2)
Total Protein: 6.7 g/dL (ref 6.5–8.1)

## 2020-08-23 LAB — CBC
HCT: 39.5 % (ref 36.0–46.0)
Hemoglobin: 12.9 g/dL (ref 12.0–15.0)
MCH: 29.9 pg (ref 26.0–34.0)
MCHC: 32.7 g/dL (ref 30.0–36.0)
MCV: 91.4 fL (ref 80.0–100.0)
Platelets: 228 10*3/uL (ref 150–400)
RBC: 4.32 MIL/uL (ref 3.87–5.11)
RDW: 13 % (ref 11.5–15.5)
WBC: 5 10*3/uL (ref 4.0–10.5)
nRBC: 0 % (ref 0.0–0.2)

## 2020-08-23 LAB — TYPE AND SCREEN
ABO/RH(D): A POS
Antibody Screen: NEGATIVE

## 2020-08-23 LAB — URINALYSIS, ROUTINE W REFLEX MICROSCOPIC
Bilirubin Urine: NEGATIVE
Glucose, UA: NEGATIVE mg/dL
Hgb urine dipstick: NEGATIVE
Ketones, ur: NEGATIVE mg/dL
Leukocytes,Ua: NEGATIVE
Nitrite: NEGATIVE
Protein, ur: NEGATIVE mg/dL
Specific Gravity, Urine: 1.019 (ref 1.005–1.030)
pH: 6 (ref 5.0–8.0)

## 2020-08-23 LAB — PROTIME-INR
INR: 0.9 (ref 0.8–1.2)
Prothrombin Time: 12.2 seconds (ref 11.4–15.2)

## 2020-08-23 LAB — SURGICAL PCR SCREEN
MRSA, PCR: NEGATIVE
Staphylococcus aureus: NEGATIVE

## 2020-08-23 LAB — APTT: aPTT: 27 seconds (ref 24–36)

## 2020-08-23 LAB — SEDIMENTATION RATE: Sed Rate: 20 mm/hr (ref 0–30)

## 2020-08-23 LAB — C-REACTIVE PROTEIN: CRP: 0.7 mg/dL (ref <1.0)

## 2020-08-23 NOTE — Patient Instructions (Addendum)
Your procedure is scheduled on: 08/30/20 Report to [facility]. To find out your arrival time please call [phone] between 1PM - 3PM on 08/29/20.  Remember: Instructions that are not followed completely may result in serious medical risk, up to and including death, or upon the discretion of your surgeon and anesthesiologist your surgery may need to be rescheduled.     _X__ 1. Do not eat food after midnight the night before your procedure.                 No gum chewing or hard candies. You may drink clear liquids up to 2 hours                 before you are scheduled to arrive for your surgery- DO not drink clear                 liquids within 2 hours of the start of your surgery.                 Clear Liquids include:  water, apple juice without pulp, clear carbohydrate                 drink such as Clearfast or Gatorade, Black Coffee or Tea (Do not add                 anything to coffee or tea). Diabetics water only  __X__2.  On the morning of surgery brush your teeth with toothpaste and water, you                 may rinse your mouth with mouthwash if you wish.  Do not swallow any              toothpaste of mouthwash.     _X__ 3.  No Alcohol for 24 hours before or after surgery.   _X__ 4.  Do Not Smoke or use e-cigarettes For 24 Hours Prior to Your Surgery.                 Do not use any chewable tobacco products for at least 6 hours prior to                 surgery.  ____  5.  Bring all medications with you on the day of surgery if instructed.   __X__  6.  Notify your doctor if there is any change in your medical condition      (cold, fever, infections).     Do not wear jewelry, make-up, hairpins, clips or nail polish. Do not wear lotions, powders, or perfumes.  Do not shave 48 hours prior to surgery. Men may shave face and neck. Do not bring valuables to the hospital.    [facility] is not responsible for any belongings or  valuables.  Contacts, dentures/partials or body piercings may not be worn into surgery. Bring a case for your contacts, glasses or hearing aids, a denture cup will be supplied. Leave your suitcase in the car. After surgery it may be brought to your room. For patients admitted to the hospital, discharge time is determined by your treatment team.   Patients discharged the day of surgery will not be allowed to drive home.   Please read over the following fact sheets that you were given:   MRSA Information  __X__ Take these medicines the morning of surgery with A SIP OF WATER:  1. buPROPion (WELLBUTRIN XL) 300 MG 24 hr tablet  2. DULoxetine (CYMBALTA) 30 MG capsule  3. levothyroxine (SYNTHROID, LEVOTHROID) 50 MCG tablet  4. omeprazole (PRILOSEC) 10 MG capsule  5.  6.  ____ Fleet Enema (as directed)   __X__ Use CHG Soap/SAGE wipes as directed  ____ Use inhalers on the day of surgery  ____ Stop metformin/Janumet/Farxiga 2 days prior to surgery    ____ Take 1/2 of usual insulin dose the night before surgery. No insulin the morning          of surgery.   ____ Stop Blood Thinners Coumadin/Plavix/Xarelto/Pleta/Pradaxa/Eliquis/Effient/Aspirin  on   Or contact your Surgeon, Cardiologist or Medical Doctor regarding  ability to stop your blood thinners  __X__ Stop Anti-inflammatories 7 days before surgery such as Advil, Ibuprofen, Motrin,  BC or Goodies Powder, Naprosyn, Naproxen, Aleve, Aspirin   Fiorinal has aspirin in it so do not take x 7 days  __X__ Stop all herbal supplements, fish oil or vitamin E until after surgery.    ____ Bring C-Pap to the hospital.

## 2020-08-24 LAB — URINE CULTURE
Culture: NO GROWTH
Special Requests: NORMAL

## 2020-08-27 NOTE — H&P (Signed)
ORTHOPAEDIC HISTORY & PHYSICAL Cindy Carpenter, [place] - 08/22/2020 8:45 AM EDT Formatting of this note is different from the original. [place] MEDIC[facility] Chief Complaint:   Chief Complaint  Patient presents with  . Knee Pain  H & P LEFT TOTAL KNEE ARTHROPLASTY   History of Present Illness:   Cindy Carpenter is a 65 y.o. female that presents to clinic today for her preoperative history and evaluation. Patient presents unaccompanied. The patient is scheduled to undergo a left total knee arthroplasty on 08/30/20 by Dr. Marry Guan. Patient previously underwent a left ACL reconstruction by Dr. Aldean Jewett around [phone]. Her pain began around 3 years ago. The pain is located primarily along the medial aspect of the knee. She describes her pain as worse with weightbearing. She reports associated swelling of the knee. She denies associated numbness or tingling, denies locking or giving way of the knee.   The patient's symptoms have progressed to the point that they decrease her quality of life. The patient has previously undergone conservative treatment including NSAIDS and injections to the knee without adequate control of her symptoms.  Patient does report history of anaphylaxis to penicillin. Denies history of blood clots, lumbar surgery, or significant cardiac history.   Past Medical, Surgical, Family, Social History, Allergies, Medications:   Past Medical History:  Past Medical History:  Diagnosis Date  . Acquired hypothyroidism, unspecified 11/09/2019  . Allergy  . Chronic insomnia  . GERD (gastroesophageal reflux disease)  with dyspepsia  . History of migraine headaches  . History of pneumonia  . Migraine headache 1966  . Osteoarthrosis involving, or with mention of more than one site, but not specified as generalized, multiple sites  . Osteopenia  . Other and unspecified hyperlipidemia  . Radiculopathy of lumbar region 01/05/2015   Past Surgical  History:  Past Surgical History:  Procedure Laterality Date  . APPENDECTOMY  . Arthroscopic release of long head of biceps tendon along with arthroscopic subacromial decompression followed by mini incision rotator cuff repair Right 03/25/14  Dr. Jefm Bryant  . FRACTURE SURGERY 2013,14  . Right arthroscopic rotator cuff repair, R arthroscopic extesnsive debridement of shoulder (glenohumeral and subacromial spaces) subacromial decpmpression Right 12/06/2019  Dr. Posey Pronto  . status post left knee surgery  . TONSILLECTOMY   Current Medications:  Current Outpatient Medications  Medication Sig Dispense Refill  . acetaminophen (TYLENOL) 500 MG tablet Take 1,000 mg by mouth every 8 (eight) hours as needed  . buPROPion (WELLBUTRIN XL) 300 MG XL tablet Take 300 mg by mouth every morning  . butalbital-aspirin-caffeine (FIORINAL) 50-325-40 mg capsule TAKE 1 CAPSULE BY MOUTH EVERY 4 HOURS AS NEEDED FOR HEADACHE 60 capsule 2  . calcium carbonate-vitamin D3 (OS-CAL 500+D) 500 mg(1,250mg ) -200 unit tablet Take 1 tablet by mouth 2 (two) times daily with meals.  . DULoxetine (CYMBALTA) 30 MG DR capsule Take 1 capsule (30 mg total) by mouth once daily 30 capsule 11  . estradioL (ESTRACE) 0.01 % (0.1 mg/gram) vaginal cream Place 0.5 g vaginally once daily 30 g 3  . fluticasone propionate (FLONASE) 50 mcg/actuation nasal spray Place 2 sprays into both nostrils 2 (two) times daily as needed 48 g 3  . levothyroxine (SYNTHROID) 50 MCG tablet Take 1 tablet (50 mcg total) by mouth every morning before breakfast (0630) ON AN EMPTY STOMACH WITH A GLASS OF WATER AT LEAST 30 TO 60 MINUTES BEFORE BREAKFAST 90 tablet 3  . multivitamin-lutein (MULTIVITAMIN 50 PLUS) tablet Take 1 tablet  by mouth once daily  . omeprazole (PRILOSEC) 10 MG DR capsule Take 1 capsule (10 mg total) by mouth once daily 90 capsule 3  . raloxifene (EVISTA) 60 mg tablet TAKE 1 TABLET ONCE DAILY 90 tablet 3  . simvastatin (ZOCOR) 20 MG tablet Take 20 mg by  mouth nightly  . solifenacin (VESICARE) 10 MG tablet Take 1 tablet (10 mg total) by mouth once daily 30 tablet 11  . traZODone (DESYREL) 50 MG tablet Take 1 tablet (50 mg total) by mouth nightly 90 tablet 3   No current facility-administered medications for this visit.   Allergies:  Allergies  Allergen Reactions  . Other Anaphylaxis  IVP contrast dye  . Penicillin G Potassium Anaphylaxis  . Shellfish Containing Products Swelling  . Codeine Phosphate Nausea  . Morphine Nausea   Social History:  Social History   Socioeconomic History  . Marital status: Married  Spouse name: [employer]"  . Number of children: 0  . Years of education: 50  Occupational History  . Occupation: Part-time Dance movement psychotherapist  Tobacco Use  . Smoking status: Never Smoker  . Smokeless tobacco: Never Used  Vaping Use  . Vaping Use: Never used  Substance and Sexual Activity  . Alcohol use: Yes  Alcohol/week: 0.0 standard drinks  . Drug use: No  . Sexual activity: Yes  Partners: Male  Birth control/protection: None, Post-menopausal   Family History:  Family History  Problem Relation Age of Onset  . Kidney failure Mother  . High blood pressure (Hypertension) Mother  . Myocardial Infarction (Heart attack) Mother  . Kidney disease Mother  . Migraines Mother  . Osteoarthritis Mother  . Diabetes type II Father  . Heart failure Father  . Coronary Artery Disease (Blocked arteries around heart) Father  . Myocardial Infarction (Heart attack) Father  . Hyperlipidemia (Elevated cholesterol) Father  . Cancer Brother  . Hyperlipidemia (Elevated cholesterol) Brother  . Ovarian cancer Other  . Hyperlipidemia (Elevated cholesterol) Sister   Review of Systems:   A 10+ ROS was performed, reviewed, and the pertinent orthopaedic findings are documented in the HPI.   Physical Examination:   BP 116/76 (BP Location: Left upper arm, Patient Position: Sitting, BP Cuff Size: Adult)  Ht 157.5 cm (5\' 2" )  Wt 74.7 kg  (164 lb 9.6 oz)  LMP (LMP Unknown)  BMI 30.11 kg/m   Patient is a well-developed, well-nourished female in no acute distress. Patient has normal mood and affect. Patient is alert and oriented to person, place, and time.   HEENT: Atraumatic, normocephalic. Pupils equal and reactive to light. Extraocular motion intact. Noninjected sclera.  Cardiovascular: Regular rate and rhythm, with no murmurs, rubs, or gallops. Distal pulses palpable.  Respiratory: Lungs clear to auscultation bilaterally.   Left Knee: Soft tissue swelling: minimal Effusion: none Erythema: none Crepitance: minimal Tenderness: medial Alignment: relative varus Mediolateral laxity: medial pseudolaxity Posterior sag: negative Patellar tracking: Good tracking without evidence of subluxation or tilt Atrophy: No significant atrophy.  Quadriceps tone was fair to good. Range of motion: 0/0/124 degrees  Sensation intact over the saphenous, lateral sural cutaneous, superficial fibular, and deep fibular nerve distributions.  Tests Performed/Reviewed:  X-rays  3 views of the left knee were obtained. Images reveal severe loss of medial compartment joint space with osteophyte formation. Bone-on-bone contact and subchondral sclerosis noted. No fractures or dislocations. Surgical clips from previous ACL reconstruction noted in both the femur and proximal tibia.  I personally ordered and interpreted today's x-rays.  Impression:  ICD-10-CM  1. Primary osteoarthritis of left knee M17.12   Plan:   The patient has end-stage degenerative changes of the left knee. It was explained to the patient that the condition is progressive in nature. Having failed conservative treatment, the patient has elected to proceed with a total joint arthroplasty. The patient will undergo a total joint arthroplasty with Dr. Marry Guan. The risks of surgery, including blood clot and infection, were discussed with the patient. Measures to reduce these  risks, including the use of anticoagulation, perioperative antibiotics, and early ambulation were discussed. The importance of postoperative physical therapy was discussed with the patient. The patient elects to proceed with surgery. The patient is instructed to stop all blood thinners prior to surgery. The patient is instructed to call the hospital the day before surgery to learn of the proper arrival time.   Contact our office with any questions or concerns. Follow up as indicated, or sooner should any new problems arise, if conditions worsen, or if they are otherwise concerned.   Cindy Carpenter, [facility] [place], [place] [address] Phone: [phone]  This note was generated in part with voice recognition software and I apologize for any typographical errors that were not detected and corrected.  Electronically signed by Cindy Fudge, PA at 08/22/2020 1:14 PM EDT

## 2020-08-28 ENCOUNTER — Other Ambulatory Visit: Payer: Self-pay

## 2020-08-28 ENCOUNTER — Other Ambulatory Visit
Admission: RE | Admit: 2020-08-28 | Discharge: 2020-08-28 | Disposition: A | Discharge status: Home / Self Care | Payer: Medicare Other | Source: Ambulatory Visit | Attending: Orthopedic Surgery | Admitting: Orthopedic Surgery

## 2020-08-28 DIAGNOSIS — Z20822 Contact with and (suspected) exposure to covid-19: Secondary | ICD-10-CM | POA: Insufficient documentation

## 2020-08-28 DIAGNOSIS — Z01812 Encounter for preprocedural laboratory examination: Secondary | ICD-10-CM | POA: Insufficient documentation

## 2020-08-28 LAB — SARS CORONAVIRUS 2 (TAT 6-24 HRS): SARS Coronavirus 2: NEGATIVE

## 2020-08-29 MED ORDER — GABAPENTIN 300 MG PO CAPS: 300.0000 mg | ORAL_CAPSULE | Freq: Once | ORAL | Status: AC

## 2020-08-29 MED ORDER — CHLORHEXIDINE GLUCONATE 4 % EX LIQD
60.0000 mL | Freq: Once | CUTANEOUS | Status: AC
  Administered 2020-08-30: 4 via TOPICAL

## 2020-08-29 MED ORDER — LIDOCAINE HCL (PF) 2 % IJ SOLN
INTRAMUSCULAR | Status: AC
  Filled 2020-08-29: qty 2

## 2020-08-29 MED ORDER — LIDOCAINE HCL URETHRAL/MUCOSAL 2 % EX GEL
CUTANEOUS | Status: AC
  Filled 2020-08-29: qty 5

## 2020-08-29 MED ORDER — DEXAMETHASONE SODIUM PHOSPHATE 10 MG/ML IJ SOLN: 8.0000 mg | Freq: Once | INTRAMUSCULAR | Status: AC

## 2020-08-29 MED ORDER — LACTATED RINGERS IV SOLN: INTRAVENOUS | Status: DC

## 2020-08-29 MED ORDER — CELECOXIB 200 MG PO CAPS: 400.0000 mg | ORAL_CAPSULE | Freq: Once | ORAL | Status: AC

## 2020-08-29 MED ORDER — CHLORHEXIDINE GLUCONATE 0.12 % MT SOLN: 15.0000 mL | Freq: Once | OROMUCOSAL | Status: AC

## 2020-08-29 MED ORDER — PROPOFOL 10 MG/ML IV BOLUS
INTRAVENOUS | Status: AC
  Filled 2020-08-29: qty 20

## 2020-08-29 MED ORDER — CEFAZOLIN SODIUM-DEXTROSE 2-4 GM/100ML-% IV SOLN
2.0000 g | INTRAVENOUS | Status: AC
  Administered 2020-08-30: 2 g via INTRAVENOUS

## 2020-08-29 MED ORDER — TRANEXAMIC ACID-NACL 1000-0.7 MG/100ML-% IV SOLN
1000.0000 mg | INTRAVENOUS | Status: AC
  Administered 2020-08-30: 1000 mg via INTRAVENOUS

## 2020-08-29 MED ORDER — ORAL CARE MOUTH RINSE: 15.0000 mL | Freq: Once | OROMUCOSAL | Status: AC

## 2020-08-30 ENCOUNTER — Encounter: Payer: Self-pay | Admitting: Orthopedic Surgery

## 2020-08-30 ENCOUNTER — Inpatient Hospital Stay: Payer: Medicare Other

## 2020-08-30 ENCOUNTER — Inpatient Hospital Stay
Admission: RE | Admit: 2020-08-30 | Discharge: 2020-08-31 | DRG: 470 | Disposition: A | Discharge status: Home / Self Care | Payer: Medicare Other | Attending: Orthopedic Surgery | Admitting: Orthopedic Surgery

## 2020-08-30 ENCOUNTER — Encounter
Admission: RE | Disposition: A | Discharge status: Home / Self Care | Payer: Self-pay | Source: Home / Self Care | Attending: Orthopedic Surgery

## 2020-08-30 ENCOUNTER — Inpatient Hospital Stay: Payer: Medicare Other | Admitting: Anesthesiology

## 2020-08-30 ENCOUNTER — Inpatient Hospital Stay: Payer: Medicare Other | Admitting: Urgent Care

## 2020-08-30 ENCOUNTER — Other Ambulatory Visit: Payer: Self-pay

## 2020-08-30 DIAGNOSIS — F5104 Psychophysiologic insomnia: Secondary | ICD-10-CM | POA: Diagnosis present

## 2020-08-30 DIAGNOSIS — M5416 Radiculopathy, lumbar region: Secondary | ICD-10-CM | POA: Diagnosis present

## 2020-08-30 DIAGNOSIS — M8580 Other specified disorders of bone density and structure, unspecified site: Secondary | ICD-10-CM | POA: Diagnosis present

## 2020-08-30 DIAGNOSIS — Z91041 Radiographic dye allergy status: Secondary | ICD-10-CM

## 2020-08-30 DIAGNOSIS — Z91013 Allergy to seafood: Secondary | ICD-10-CM

## 2020-08-30 DIAGNOSIS — M25762 Osteophyte, left knee: Secondary | ICD-10-CM | POA: Diagnosis present

## 2020-08-30 DIAGNOSIS — Z79899 Other long term (current) drug therapy: Secondary | ICD-10-CM | POA: Diagnosis not present

## 2020-08-30 DIAGNOSIS — Z880 Allergy status to penicillin: Secondary | ICD-10-CM | POA: Diagnosis not present

## 2020-08-30 DIAGNOSIS — Z8701 Personal history of pneumonia (recurrent): Secondary | ICD-10-CM | POA: Diagnosis not present

## 2020-08-30 DIAGNOSIS — Z20822 Contact with and (suspected) exposure to covid-19: Secondary | ICD-10-CM | POA: Diagnosis present

## 2020-08-30 DIAGNOSIS — Z79890 Hormone replacement therapy (postmenopausal): Secondary | ICD-10-CM

## 2020-08-30 DIAGNOSIS — M19041 Primary osteoarthritis, right hand: Secondary | ICD-10-CM | POA: Diagnosis present

## 2020-08-30 DIAGNOSIS — M1712 Unilateral primary osteoarthritis, left knee: Secondary | ICD-10-CM | POA: Diagnosis present

## 2020-08-30 DIAGNOSIS — E785 Hyperlipidemia, unspecified: Secondary | ICD-10-CM | POA: Diagnosis present

## 2020-08-30 DIAGNOSIS — Z87442 Personal history of urinary calculi: Secondary | ICD-10-CM | POA: Diagnosis not present

## 2020-08-30 DIAGNOSIS — Z885 Allergy status to narcotic agent status: Secondary | ICD-10-CM

## 2020-08-30 DIAGNOSIS — E039 Hypothyroidism, unspecified: Secondary | ICD-10-CM | POA: Diagnosis present

## 2020-08-30 DIAGNOSIS — Z6830 Body mass index (BMI) 30.0-30.9, adult: Secondary | ICD-10-CM

## 2020-08-30 DIAGNOSIS — G43909 Migraine, unspecified, not intractable, without status migrainosus: Secondary | ICD-10-CM | POA: Diagnosis present

## 2020-08-30 DIAGNOSIS — E669 Obesity, unspecified: Secondary | ICD-10-CM | POA: Diagnosis present

## 2020-08-30 DIAGNOSIS — M19042 Primary osteoarthritis, left hand: Secondary | ICD-10-CM | POA: Diagnosis present

## 2020-08-30 DIAGNOSIS — K219 Gastro-esophageal reflux disease without esophagitis: Secondary | ICD-10-CM | POA: Diagnosis present

## 2020-08-30 DIAGNOSIS — Z87892 Personal history of anaphylaxis: Secondary | ICD-10-CM | POA: Diagnosis not present

## 2020-08-30 DIAGNOSIS — Z96659 Presence of unspecified artificial knee joint: Secondary | ICD-10-CM

## 2020-08-30 HISTORY — PX: KNEE ARTHROPLASTY: SHX992

## 2020-08-30 LAB — IGE: IgE (Immunoglobulin E), Serum: 56 IU/mL (ref 6–495)

## 2020-08-30 LAB — ABO/RH: ABO/RH(D): A POS

## 2020-08-30 SURGERY — ARTHROPLASTY, KNEE, TOTAL, USING IMAGELESS COMPUTER-ASSISTED NAVIGATION
Anesthesia: Spinal | Site: Knee | Laterality: Left

## 2020-08-30 MED ORDER — FERROUS SULFATE 325 (65 FE) MG PO TABS
325.0000 mg | ORAL_TABLET | Freq: Two times a day (BID) | ORAL | Status: DC
  Administered 2020-08-30: 325 mg via ORAL
  Filled 2020-08-30 (×2): qty 1

## 2020-08-30 MED ORDER — ALUM & MAG HYDROXIDE-SIMETH 200-200-20 MG/5ML PO SUSP: 30.0000 mL | ORAL | Status: DC | PRN

## 2020-08-30 MED ORDER — MENTHOL 3 MG MT LOZG
1.0000 | LOZENGE | OROMUCOSAL | Status: DC | PRN
  Filled 2020-08-30: qty 9

## 2020-08-30 MED ORDER — ONDANSETRON HCL 4 MG/2ML IJ SOLN: 4.0000 mg | Freq: Four times a day (QID) | INTRAMUSCULAR | Status: DC | PRN

## 2020-08-30 MED ORDER — PROPOFOL 1000 MG/100ML IV EMUL
INTRAVENOUS | Status: AC
  Filled 2020-08-30: qty 100

## 2020-08-30 MED ORDER — PROPOFOL 500 MG/50ML IV EMUL
INTRAVENOUS | Status: DC | PRN
  Administered 2020-08-30: 200 ug/kg/min via INTRAVENOUS

## 2020-08-30 MED ORDER — TRANEXAMIC ACID-NACL 1000-0.7 MG/100ML-% IV SOLN: 1000.0000 mg | Freq: Once | INTRAVENOUS | Status: AC

## 2020-08-30 MED ORDER — SODIUM CHLORIDE (PF) 0.9 % IJ SOLN
INTRAMUSCULAR | Status: AC
  Filled 2020-08-30: qty 100

## 2020-08-30 MED ORDER — TRAZODONE HCL 50 MG PO TABS
50.0000 mg | ORAL_TABLET | Freq: Every day | ORAL | Status: DC
  Administered 2020-08-30: 100 mg via ORAL
  Filled 2020-08-30: qty 2

## 2020-08-30 MED ORDER — ENOXAPARIN SODIUM 30 MG/0.3ML IJ SOSY
30.0000 mg | PREFILLED_SYRINGE | Freq: Two times a day (BID) | INTRAMUSCULAR | Status: DC
  Administered 2020-08-31: 30 mg via SUBCUTANEOUS
  Filled 2020-08-30: qty 0.3

## 2020-08-30 MED ORDER — BUPROPION HCL ER (XL) 150 MG PO TB24
300.0000 mg | ORAL_TABLET | Freq: Every day | ORAL | Status: DC
  Administered 2020-08-31: 300 mg via ORAL
  Filled 2020-08-30: qty 2

## 2020-08-30 MED ORDER — CEFAZOLIN SODIUM-DEXTROSE 2-4 GM/100ML-% IV SOLN
INTRAVENOUS | Status: AC
  Filled 2020-08-30: qty 100

## 2020-08-30 MED ORDER — PROPOFOL 500 MG/50ML IV EMUL
INTRAVENOUS | Status: AC
  Filled 2020-08-30: qty 100

## 2020-08-30 MED ORDER — BUPIVACAINE HCL (PF) 0.25 % IJ SOLN
INTRAMUSCULAR | Status: AC
  Filled 2020-08-30: qty 120

## 2020-08-30 MED ORDER — PROMETHAZINE HCL 25 MG/ML IJ SOLN: 6.2500 mg | INTRAMUSCULAR | Status: DC | PRN

## 2020-08-30 MED ORDER — ACETAMINOPHEN 10 MG/ML IV SOLN: 1000.0000 mg | Freq: Four times a day (QID) | INTRAVENOUS | Status: DC

## 2020-08-30 MED ORDER — ENSURE PRE-SURGERY PO LIQD
296.0000 mL | Freq: Once | ORAL | Status: AC
  Administered 2020-08-30: 296 mL via ORAL
  Filled 2020-08-30: qty 296

## 2020-08-30 MED ORDER — SURGIPHOR WOUND IRRIGATION SYSTEM - OPTIME
TOPICAL | Status: DC | PRN
  Administered 2020-08-30: 1 via TOPICAL

## 2020-08-30 MED ORDER — SODIUM CHLORIDE 0.9 % IV SOLN: INTRAVENOUS | Status: DC

## 2020-08-30 MED ORDER — TRAMADOL HCL 50 MG PO TABS
50.0000 mg | ORAL_TABLET | ORAL | Status: DC | PRN
  Administered 2020-08-30: 100 mg via ORAL
  Filled 2020-08-30 (×2): qty 2

## 2020-08-30 MED ORDER — BUPIVACAINE HCL (PF) 0.5 % IJ SOLN
INTRAMUSCULAR | Status: DC | PRN
  Administered 2020-08-30: 3 mL

## 2020-08-30 MED ORDER — DIPHENHYDRAMINE HCL 12.5 MG/5ML PO ELIX: 12.5000 mg | ORAL_SOLUTION | ORAL | Status: DC | PRN

## 2020-08-30 MED ORDER — OXYCODONE HCL 5 MG PO TABS
5.0000 mg | ORAL_TABLET | ORAL | Status: DC | PRN
  Administered 2020-08-30: 5 mg via ORAL
  Filled 2020-08-30: qty 1

## 2020-08-30 MED ORDER — SODIUM CHLORIDE 0.9 % IV SOLN
INTRAVENOUS | Status: DC | PRN
  Administered 2020-08-30: 60 mL

## 2020-08-30 MED ORDER — ACETAMINOPHEN 500 MG PO TABS
1000.0000 mg | ORAL_TABLET | Freq: Four times a day (QID) | ORAL | Status: AC
  Administered 2020-08-30 – 2020-08-31 (×3): 1000 mg via ORAL
  Filled 2020-08-30 (×4): qty 2

## 2020-08-30 MED ORDER — PANTOPRAZOLE SODIUM 40 MG PO TBEC
40.0000 mg | DELAYED_RELEASE_TABLET | Freq: Two times a day (BID) | ORAL | Status: DC
  Administered 2020-08-30 – 2020-08-31 (×3): 40 mg via ORAL
  Filled 2020-08-30 (×3): qty 1

## 2020-08-30 MED ORDER — EPHEDRINE SULFATE 50 MG/ML IJ SOLN
INTRAMUSCULAR | Status: DC | PRN
  Administered 2020-08-30 (×2): 10 mg via INTRAVENOUS

## 2020-08-30 MED ORDER — DEXAMETHASONE SODIUM PHOSPHATE 10 MG/ML IJ SOLN
INTRAMUSCULAR | Status: AC
  Administered 2020-08-30: 8 mg via INTRAVENOUS
  Filled 2020-08-30: qty 1

## 2020-08-30 MED ORDER — CELECOXIB 200 MG PO CAPS
200.0000 mg | ORAL_CAPSULE | Freq: Two times a day (BID) | ORAL | Status: DC
  Administered 2020-08-30 – 2020-08-31 (×2): 200 mg via ORAL
  Filled 2020-08-30 (×2): qty 1

## 2020-08-30 MED ORDER — PROPOFOL 500 MG/50ML IV EMUL
INTRAVENOUS | Status: AC
  Filled 2020-08-30: qty 50

## 2020-08-30 MED ORDER — SIMVASTATIN 20 MG PO TABS
20.0000 mg | ORAL_TABLET | Freq: Every day | ORAL | Status: DC
  Administered 2020-08-30: 20 mg via ORAL
  Filled 2020-08-30: qty 1

## 2020-08-30 MED ORDER — MAGNESIUM HYDROXIDE 400 MG/5ML PO SUSP
30.0000 mL | Freq: Every day | ORAL | Status: DC
  Administered 2020-08-30 – 2020-08-31 (×2): 30 mL via ORAL
  Filled 2020-08-30 (×2): qty 30

## 2020-08-30 MED ORDER — PROPOFOL 10 MG/ML IV BOLUS
INTRAVENOUS | Status: AC
  Filled 2020-08-30: qty 20

## 2020-08-30 MED ORDER — FLEET ENEMA 7-19 GM/118ML RE ENEM: 1.0000 | ENEMA | Freq: Once | RECTAL | Status: DC | PRN

## 2020-08-30 MED ORDER — ONDANSETRON HCL 4 MG PO TABS: 4.0000 mg | ORAL_TABLET | Freq: Four times a day (QID) | ORAL | Status: DC | PRN

## 2020-08-30 MED ORDER — BUPIVACAINE LIPOSOME 1.3 % IJ SUSP
INTRAMUSCULAR | Status: AC
  Filled 2020-08-30: qty 40

## 2020-08-30 MED ORDER — SODIUM CHLORIDE 0.9 % IR SOLN
Status: DC | PRN
  Administered 2020-08-30: 500 mL

## 2020-08-30 MED ORDER — FENTANYL CITRATE (PF) 100 MCG/2ML IJ SOLN: 25.0000 ug | INTRAMUSCULAR | Status: DC | PRN

## 2020-08-30 MED ORDER — APREPITANT 40 MG PO CAPS
ORAL_CAPSULE | ORAL | Status: AC
  Administered 2020-08-30: 40 mg
  Filled 2020-08-30: qty 1

## 2020-08-30 MED ORDER — BUPIVACAINE HCL (PF) 0.25 % IJ SOLN
INTRAMUSCULAR | Status: DC | PRN
  Administered 2020-08-30: 60 mL

## 2020-08-30 MED ORDER — GABAPENTIN 300 MG PO CAPS
ORAL_CAPSULE | ORAL | Status: AC
  Administered 2020-08-30: 300 mg via ORAL
  Filled 2020-08-30: qty 1

## 2020-08-30 MED ORDER — CHLORHEXIDINE GLUCONATE 0.12 % MT SOLN
OROMUCOSAL | Status: AC
  Administered 2020-08-30: 15 mL via OROMUCOSAL
  Filled 2020-08-30: qty 15

## 2020-08-30 MED ORDER — METOCLOPRAMIDE HCL 10 MG PO TABS
10.0000 mg | ORAL_TABLET | Freq: Three times a day (TID) | ORAL | Status: DC
  Administered 2020-08-30 – 2020-08-31 (×5): 10 mg via ORAL
  Filled 2020-08-30 (×4): qty 1

## 2020-08-30 MED ORDER — OXYCODONE HCL 5 MG PO TABS
10.0000 mg | ORAL_TABLET | ORAL | Status: DC | PRN
Start: 2020-08-30 — End: 2020-08-31

## 2020-08-30 MED ORDER — ONDANSETRON HCL 4 MG/2ML IJ SOLN
INTRAMUSCULAR | Status: DC | PRN
  Administered 2020-08-30: 4 mg via INTRAVENOUS

## 2020-08-30 MED ORDER — DULOXETINE HCL 30 MG PO CPEP
30.0000 mg | ORAL_CAPSULE | Freq: Every day | ORAL | Status: DC
  Administered 2020-08-31: 30 mg via ORAL
  Filled 2020-08-30: qty 1

## 2020-08-30 MED ORDER — CELECOXIB 200 MG PO CAPS
ORAL_CAPSULE | ORAL | Status: AC
  Administered 2020-08-30: 400 mg via ORAL
  Filled 2020-08-30: qty 2

## 2020-08-30 MED ORDER — SENNOSIDES-DOCUSATE SODIUM 8.6-50 MG PO TABS
1.0000 | ORAL_TABLET | Freq: Two times a day (BID) | ORAL | Status: DC
  Administered 2020-08-30 – 2020-08-31 (×3): 1 via ORAL
  Filled 2020-08-30 (×3): qty 1

## 2020-08-30 MED ORDER — PHENOL 1.4 % MT LIQD
1.0000 | OROMUCOSAL | Status: DC | PRN
  Filled 2020-08-30: qty 177

## 2020-08-30 MED ORDER — CEFAZOLIN SODIUM-DEXTROSE 2-4 GM/100ML-% IV SOLN
2.0000 g | Freq: Four times a day (QID) | INTRAVENOUS | Status: AC
  Administered 2020-08-30 (×2): 2 g via INTRAVENOUS
  Filled 2020-08-30 (×2): qty 100

## 2020-08-30 MED ORDER — RALOXIFENE HCL 60 MG PO TABS
60.0000 mg | ORAL_TABLET | Freq: Every day | ORAL | Status: DC
  Administered 2020-08-30 – 2020-08-31 (×2): 60 mg via ORAL
  Filled 2020-08-30 (×2): qty 1

## 2020-08-30 MED ORDER — HYDROMORPHONE HCL 1 MG/ML IJ SOLN: 0.5000 mg | INTRAMUSCULAR | Status: DC | PRN

## 2020-08-30 MED ORDER — TRANEXAMIC ACID-NACL 1000-0.7 MG/100ML-% IV SOLN
INTRAVENOUS | Status: AC
  Administered 2020-08-30: 1000 mg via INTRAVENOUS
  Filled 2020-08-30: qty 100

## 2020-08-30 MED ORDER — SODIUM BICARBONATE 8.4 % IV SOLN
INTRAVENOUS | Status: AC
  Filled 2020-08-30: qty 50

## 2020-08-30 MED ORDER — ACETAMINOPHEN 10 MG/ML IV SOLN
INTRAVENOUS | Status: AC
  Filled 2020-08-30: qty 100

## 2020-08-30 MED ORDER — BUPIVACAINE HCL (PF) 0.5 % IJ SOLN
INTRAMUSCULAR | Status: AC
  Filled 2020-08-30: qty 10

## 2020-08-30 MED ORDER — SODIUM CHLORIDE 0.9 % IV SOLN
INTRAVENOUS | Status: DC | PRN
  Administered 2020-08-30: 50 ug/min via INTRAVENOUS

## 2020-08-30 MED ORDER — BUTALBITAL-APAP-CAFFEINE 50-325-40 MG PO TABS
1.0000 | ORAL_TABLET | ORAL | Status: DC | PRN
  Administered 2020-08-30 – 2020-08-31 (×2): 1 via ORAL
  Filled 2020-08-30 (×2): qty 1

## 2020-08-30 MED ORDER — CALCIUM CARBONATE-VITAMIN D 500-200 MG-UNIT PO TABS
1.0000 | ORAL_TABLET | Freq: Two times a day (BID) | ORAL | Status: DC
  Administered 2020-08-30 – 2020-08-31 (×2): 1 via ORAL
  Filled 2020-08-30: qty 1

## 2020-08-30 MED ORDER — TRANEXAMIC ACID-NACL 1000-0.7 MG/100ML-% IV SOLN
INTRAVENOUS | Status: AC
  Filled 2020-08-30: qty 100

## 2020-08-30 MED ORDER — BISACODYL 10 MG RE SUPP: 10.0000 mg | Freq: Every day | RECTAL | Status: DC | PRN

## 2020-08-30 MED ORDER — LIDOCAINE HCL (PF) 2 % IJ SOLN
INTRAMUSCULAR | Status: AC
  Filled 2020-08-30: qty 5

## 2020-08-30 MED ORDER — ACETAMINOPHEN 325 MG PO TABS: 325.0000 mg | ORAL_TABLET | Freq: Four times a day (QID) | ORAL | Status: DC | PRN

## 2020-08-30 MED ORDER — MIDAZOLAM HCL 2 MG/2ML IJ SOLN
INTRAMUSCULAR | Status: AC
  Filled 2020-08-30: qty 2

## 2020-08-30 MED ORDER — MIDAZOLAM HCL 5 MG/5ML IJ SOLN
INTRAMUSCULAR | Status: DC | PRN
  Administered 2020-08-30: 2 mg via INTRAVENOUS

## 2020-08-30 MED ORDER — ACETAMINOPHEN 10 MG/ML IV SOLN
INTRAVENOUS | Status: DC | PRN
  Administered 2020-08-30: 1000 mg via INTRAVENOUS

## 2020-08-30 MED ORDER — LEVOTHYROXINE SODIUM 50 MCG PO TABS
50.0000 ug | ORAL_TABLET | Freq: Every day | ORAL | Status: DC
  Administered 2020-08-31: 50 ug via ORAL
  Filled 2020-08-30: qty 1

## 2020-08-30 MED ORDER — FLUTICASONE PROPIONATE 50 MCG/ACT NA SUSP
1.0000 | Freq: Every day | NASAL | Status: DC | PRN
  Filled 2020-08-30: qty 16

## 2020-08-30 MED ORDER — SUCCINYLCHOLINE CHLORIDE 200 MG/10ML IV SOSY
PREFILLED_SYRINGE | INTRAVENOUS | Status: AC
  Filled 2020-08-30: qty 10

## 2020-08-30 SURGICAL SUPPLY — 72 items
ATTUNE PS FEM LT SZ 4 CEM KNEE (Femur) ×1 IMPLANT
ATTUNE PSRP INSR SZ4 10 KNEE (Insert) ×1 IMPLANT
BASE TIBIAL ROT PLAT SZ 5 KNEE (Knees) IMPLANT
BATTERY INSTRU NAVIGATION (MISCELLANEOUS) ×8 IMPLANT
BLADE SAW 70X12.5 (BLADE) ×2 IMPLANT
BLADE SAW 90X13X1.19 OSCILLAT (BLADE) ×2 IMPLANT
BLADE SAW 90X25X1.19 OSCILLAT (BLADE) ×2 IMPLANT
BONE CEMENT GENTAMICIN (Cement) ×4 IMPLANT
CEMENT BONE GENTAMICIN 40 (Cement) IMPLANT
COOLER POLAR GLACIER W/PUMP (MISCELLANEOUS) ×2 IMPLANT
COVER SURGICAL LIGHT HANDLE (MISCELLANEOUS) ×2 IMPLANT
CUFF TOURN SGL QUICK 24 (TOURNIQUET CUFF)
CUFF TRNQT CYL 24X4X16.5-23 (TOURNIQUET CUFF) IMPLANT
DRAPE 3/4 80X56 (DRAPES) ×2 IMPLANT
DRAPE U-SHAPE 47X51 STRL (DRAPES) ×1 IMPLANT
DRSG DERMACEA 8X12 NADH (GAUZE/BANDAGES/DRESSINGS) ×2 IMPLANT
DRSG MEPILEX SACRM 8.7X9.8 (GAUZE/BANDAGES/DRESSINGS) ×2 IMPLANT
DRSG OPSITE POSTOP 4X14 (GAUZE/BANDAGES/DRESSINGS) ×2 IMPLANT
DRSG TEGADERM 4X4.75 (GAUZE/BANDAGES/DRESSINGS) ×2 IMPLANT
DURAPREP 26ML APPLICATOR (WOUND CARE) ×4 IMPLANT
ELECT CAUTERY BLADE 6.4 (BLADE) ×2 IMPLANT
ELECT REM PT RETURN 9FT ADLT (ELECTROSURGICAL) ×2
ELECTRODE REM PT RTRN 9FT ADLT (ELECTROSURGICAL) ×1 IMPLANT
EX-PIN ORTHOLOCK NAV 4X150 (PIN) ×4 IMPLANT
GLOVE SURG ENC MOIS LTX SZ7.5 (GLOVE) ×4 IMPLANT
GLOVE SURG ENC TEXT LTX SZ7.5 (GLOVE) ×4 IMPLANT
GLOVE SURG UNDER LTX SZ8 (GLOVE) ×2 IMPLANT
GLOVE SURG UNDER POLY LF SZ7.5 (GLOVE) ×2 IMPLANT
GOWN STRL REUS W/ TWL LRG LVL3 (GOWN DISPOSABLE) ×2 IMPLANT
GOWN STRL REUS W/ TWL XL LVL3 (GOWN DISPOSABLE) ×1 IMPLANT
GOWN STRL REUS W/TWL LRG LVL3 (GOWN DISPOSABLE) ×2
GOWN STRL REUS W/TWL XL LVL3 (GOWN DISPOSABLE) ×1
HEMOVAC 400CC 10FR (MISCELLANEOUS) ×2 IMPLANT
HOLDER FOLEY CATH W/STRAP (MISCELLANEOUS) ×2 IMPLANT
HOOD PEEL AWAY FLYTE STAYCOOL (MISCELLANEOUS) ×4 IMPLANT
IRRIGATION SURGIPHOR STRL (IV SOLUTION) ×2 IMPLANT
IV NS IRRIG 3000ML ARTHROMATIC (IV SOLUTION) ×2 IMPLANT
KIT TURNOVER KIT A (KITS) ×2 IMPLANT
KNIFE SCULPS 14X20 (INSTRUMENTS) ×2 IMPLANT
LABEL OR SOLS (LABEL) ×2 IMPLANT
MANIFOLD NEPTUNE II (INSTRUMENTS) ×4 IMPLANT
NDL SAFETY ECLIPSE 18X1.5 (NEEDLE) ×1 IMPLANT
NDL SPNL 20GX3.5 QUINCKE YW (NEEDLE) ×2 IMPLANT
NEEDLE HYPO 18GX1.5 SHARP (NEEDLE) ×1
NEEDLE SPNL 20GX3.5 QUINCKE YW (NEEDLE) ×4 IMPLANT
NS IRRIG 500ML POUR BTL (IV SOLUTION) ×2 IMPLANT
PACK TOTAL KNEE (MISCELLANEOUS) ×2 IMPLANT
PAD WRAPON POLAR KNEE (MISCELLANEOUS) ×1 IMPLANT
PATELLA MEDIAL ATTUN 35MM KNEE (Knees) ×1 IMPLANT
PENCIL SMOKE EVACUATOR COATED (MISCELLANEOUS) ×2 IMPLANT
PIN DRILL QUICK PACK ×2 IMPLANT
PIN FIXATION 1/8DIA X 3INL (PIN) ×6 IMPLANT
PULSAVAC PLUS IRRIG FAN TIP (DISPOSABLE) ×2
SOL PREP PVP 2OZ (MISCELLANEOUS) ×2
SOLUTION PREP PVP 2OZ (MISCELLANEOUS) ×1 IMPLANT
SPONGE DRAIN TRACH 4X4 STRL 2S (GAUZE/BANDAGES/DRESSINGS) ×2 IMPLANT
STAPLER SKIN PROX 35W (STAPLE) ×2 IMPLANT
STOCKINETTE IMPERV 14X48 (MISCELLANEOUS) ×1 IMPLANT
STRAP TIBIA SHORT (MISCELLANEOUS) ×2 IMPLANT
SUCTION FRAZIER HANDLE 10FR (MISCELLANEOUS) ×1
SUCTION TUBE FRAZIER 10FR DISP (MISCELLANEOUS) ×1 IMPLANT
SUT VIC AB 0 CT1 36 (SUTURE) ×4 IMPLANT
SUT VIC AB 1 CT1 36 (SUTURE) ×4 IMPLANT
SUT VIC AB 2-0 CT2 27 (SUTURE) ×2 IMPLANT
SYR 20ML LL LF (SYRINGE) ×2 IMPLANT
SYR 30ML LL (SYRINGE) ×4 IMPLANT
TIBIAL BASE ROT PLAT SZ 5 KNEE (Knees) ×2 IMPLANT
TIP FAN IRRIG PULSAVAC PLUS (DISPOSABLE) ×1 IMPLANT
TOWEL OR 17X26 4PK STRL BLUE (TOWEL DISPOSABLE) ×2 IMPLANT
TOWER CARTRIDGE SMART MIX (DISPOSABLE) ×2 IMPLANT
TRAY FOLEY MTR SLVR 16FR STAT (SET/KITS/TRAYS/PACK) ×2 IMPLANT
WRAPON POLAR PAD KNEE (MISCELLANEOUS) ×2

## 2020-08-30 NOTE — Evaluation (Signed)
Physical Therapy Evaluation Patient Details Name: Cindy Carpenter MRN: [id number] DOB: [birth year] Today's Date: 08/30/2020   History of Present Illness  Pt is a 65 yo F diagnosed with degenerative arthrosis of the left knee and is s/p elective L TKA and hardware removal (bone staples) from prior left ACL reconstruction.  PMH includes: hypothyroidism, GERD, osteopenia, and lumbar radiculopathy.    Clinical Impression  Pt was extremely pleasant and motivated to participate during the session and overall performed very well especially considering POD#0 status.  Pt required no physical assistance during the session and demonstrated good control and stability with transfers and gait.  Pt easily ambulated 2 x 15' with no adverse symptoms or significant increase in L knee pain.  Pt will benefit from HHPT upon discharge to safely address deficits listed in patient problem list for decreased caregiver assistance and eventual return to PLOF.      Follow Up Recommendations Home health PT;Supervision for mobility/OOB    Equipment Recommendations  3in1 (PT)    Recommendations for Other Services       Precautions / Restrictions Precautions Precautions: Fall Restrictions Weight Bearing Restrictions: Yes LLE Weight Bearing: Weight bearing as tolerated Other Position/Activity Restrictions: No KI required, pt able to perform Ind LLE SLR without extensor lag.      Mobility  Bed Mobility Overal bed mobility: Modified Independent             General bed mobility comments: Min extra time and effort only    Transfers Overall transfer level: Needs assistance Equipment used: Rolling walker (2 wheeled) Transfers: Sit to/from Stand Sit to Stand: Min guard         General transfer comment: Min verbal and visual cues for sequencing for hand and L foot placement  Ambulation/Gait Ambulation/Gait assistance: Min guard Gait Distance (Feet): 15 Feet x 2   Gait Pattern/deviations: Step-to  pattern;Decreased stance time - left;Decreased step length - right Gait velocity: decreased   General Gait Details: Pt steady with ambulation with no LOB or L knee buckling with step-to pattern  Stairs            Wheelchair Mobility    Modified Rankin (Stroke Patients Only)       Balance Overall balance assessment: Needs assistance   Sitting balance-Leahy Scale: Normal     Standing balance support: During functional activity;Bilateral upper extremity supported Standing balance-Leahy Scale: Good Standing balance comment: Min lean on the RW for support                             Pertinent Vitals/Pain Pain Assessment: 0-10 Pain Score: 3  Pain Location: L knee Pain Descriptors / Indicators: Sore Pain Intervention(s): Premedicated before session;Monitored during session;Ice applied    Home Living Family/patient expects to be discharged to:: Private residence Living Arrangements: Spouse/significant other Available Help at Discharge: Family;Available 24 hours/day Type of Home: House Home Access: Stairs to enter Entrance Stairs-Rails: None Entrance Stairs-Number of Steps: 1 small threshold Home Layout: Two level;Able to live on main level with bedroom/bathroom Home Equipment: Gilford Rile - 2 wheels;Other (comment);Shower seat Additional Comments: Owns upright bilateral platform walker    Prior Function Level of Independence: Independent         Comments: Ind amb community distances without an AD, no fall history, Ind with ADLs, works PRN as cardiac rehab RN     Hand Dominance        Extremity/Trunk Assessment   Upper Extremity  Assessment Upper Extremity Assessment: Overall WFL for tasks assessed    Lower Extremity Assessment Lower Extremity Assessment: LLE deficits/detail LLE Deficits / Details: BLE ankle AROM, strength, and sensation to light touch WNL; L hip flex strength >/= 3/5 LLE: Unable to fully assess due to pain LLE Sensation: WNL LLE  Coordination: WNL       Communication   Communication: No difficulties  Cognition Arousal/Alertness: Awake/alert Behavior During Therapy: WFL for tasks assessed/performed Overall Cognitive Status: Within Functional Limits for tasks assessed                                        General Comments      Exercises Total Joint Exercises Ankle Circles/Pumps: AROM;Strengthening;Both;10 reps Quad Sets: AROM;Strengthening;Left;5 reps;10 reps Straight Leg Raises: AROM;Strengthening;Left;5 reps Long Arc Quad: AROM;Strengthening;Left;5 reps;10 reps Knee Flexion: 10 reps;5 reps;Left;Strengthening;AROM Goniometric ROM: L knee AROM: 4-78 Marching in Standing: AROM;Strengthening;Both;5 reps;Standing Other Exercises Other Exercises: HEP education per handout Other Exercises: Positioning education to promote L knee ext PROM Other Exercises: 90 deg L turn training to prevent CKC twisting on the L knee   Assessment/Plan    PT Assessment Patient needs continued PT services  PT Problem List Decreased range of motion;Decreased strength;Decreased mobility;Decreased knowledge of use of DME;Pain       PT Treatment Interventions DME instruction;Gait training;Stair training;Functional mobility training;Therapeutic activities;Therapeutic exercise;Balance training;Patient/family education    PT Goals (Current goals can be found in the Care Plan section)  Acute Rehab PT Goals Patient Stated Goal: To walk without pain and to be able to get up from the floor PT Goal Formulation: With patient Time For Goal Achievement: 09/12/20 Potential to Achieve Goals: Good    Frequency BID   Barriers to discharge        Co-evaluation               AM-PAC PT "6 Clicks" Mobility  Outcome Measure Help needed turning from your back to your side while in a flat bed without using bedrails?: A Little Help needed moving from lying on your back to sitting on the side of a flat bed without using  bedrails?: A Little Help needed moving to and from a bed to a chair (including a wheelchair)?: A Little Help needed standing up from a chair using your arms (e.g., wheelchair or bedside chair)?: A Little Help needed to walk in hospital room?: A Little Help needed climbing 3-5 steps with a railing? : A Little 6 Click Score: 18    End of Session Equipment Utilized During Treatment: Gait belt Activity Tolerance: Patient tolerated treatment well Patient left: in chair;with call bell/phone within reach;with chair alarm set;with SCD's reapplied;Other (comment) (Polar care donned to L knee) Nurse Communication: Mobility status;Weight bearing status PT Visit Diagnosis: Other abnormalities of gait and mobility (R26.89);Muscle weakness (generalized) (M62.81);Pain Pain - Right/Left: Left Pain - part of body: Knee    Time: 1630-1711 PT Time Calculation (min) (ACUTE ONLY): 41 min   Charges:   PT Evaluation $PT Eval Moderate Complexity: 1 Mod PT Treatments $Gait Training: 8-22 mins $Therapeutic Exercise: 8-22 mins       D. Royetta Asal PT, DPT 08/30/20, 5:31 PM

## 2020-08-30 NOTE — Op Note (Signed)
OPERATIVE NOTE  DATE OF SURGERY:  08/30/2020  PATIENT NAME:  Cindy Carpenter   DOB: 1956-01-25  MRN: [id number]  PRE-OPERATIVE DIAGNOSIS: Degenerative arthrosis of the left knee, primary Retained hardware (bone staples) status post left ACL reconstruction  POST-OPERATIVE DIAGNOSIS:  Same  PROCEDURE:  Left total knee arthroplasty using computer-assisted navigation Hardware removal (bone staples x2)  SURGEON:  Marciano Sequin. M.D.  ASSISTANT: Cassell Smiles, PA-C (present and scrubbed throughout the case, critical for assistance with exposure, retraction, instrumentation, and closure)  ANESTHESIA: spinal  ESTIMATED BLOOD LOSS: 50 mL  FLUIDS REPLACED: 1400 mL of crystalloid  TOURNIQUET TIME: 102 minutes  DRAINS: 2 medium Hemovac drains  SOFT TISSUE RELEASES: Anterior cruciate ligament, posterior cruciate ligament, deep medial collateral ligament, patellofemoral ligament  IMPLANTS UTILIZED: DePuy Attune size 4 posterior stabilized femoral component (cemented), size 5 rotating platform tibial component (cemented), 35 mm medialized dome patella (cemented), and a 10 mm stabilized rotating platform polyethylene insert.  INDICATIONS FOR SURGERY: Cindy Carpenter is a 65 y.o. year old female with a long history of progressive knee pain.  She had previously undergone left ACL reconstruction.  X-rays demonstrated severe degenerative changes in tricompartmental fashion and retained bone staples. The patient had not seen any significant improvement despite conservative nonsurgical intervention. After discussion of the risks and benefits of surgical intervention, the patient expressed understanding of the risks benefits and agree with plans for total knee arthroplasty.   The risks, benefits, and alternatives were discussed at length including but not limited to the risks of infection, bleeding, nerve injury, stiffness, blood clots, the need for revision surgery, cardiopulmonary complications, among  others, and they were willing to proceed.  PROCEDURE IN DETAIL: The patient was brought into the operating room and, after adequate spinal anesthesia was achieved, a tourniquet was placed on the patient's upper thigh. The patient's knee and leg were cleaned and prepped with alcohol and DuraPrep and draped in the usual sterile fashion. A "timeout" was performed as per usual protocol. The lower extremity was exsanguinated using an Esmarch, and the tourniquet was inflated to 300 mmHg. An anterior longitudinal incision was made followed by a standard mid vastus approach. The deep fibers of the medial collateral ligament were elevated in a subperiosteal fashion off of the medial flare of the tibia so as to maintain a continuous soft tissue sleeve.  2 bone staples were encountered along the medial flare of the tibia.  The holding device for the staples was attached and both staples were removed without difficulty.  The patella was subluxed laterally and the patellofemoral ligament was incised. Inspection of the knee demonstrated severe degenerative changes with full-thickness loss of articular cartilage. Osteophytes were debrided using a rongeur. Anterior and posterior cruciate ligaments were excised. Two 4.0 mm Schanz pins were inserted in the femur and into the tibia for attachment of the array of trackers used for computer-assisted navigation. Hip center was identified using a circumduction technique. Distal landmarks were mapped using the computer. The distal femur and proximal tibia were mapped using the computer. The distal femoral cutting guide was positioned using computer-assisted navigation so as to achieve a 5 distal valgus cut. The femur was sized and it was felt that a size 4 femoral component was appropriate. A size 4 femoral cutting guide was positioned and the anterior cut was performed and verified using the computer. This was followed by completion of the posterior and chamfer cuts. Femoral cutting  guide for the central box was then positioned  in the center box cut was performed.  Attention was then directed to the proximal tibia. Medial and lateral menisci were excised. The extramedullary tibial cutting guide was positioned using computer-assisted navigation so as to achieve a 0 varus-valgus alignment and 3 posterior slope. The cut was performed and verified using the computer. The proximal tibia was sized and it was felt that a size 5 tibial tray was appropriate. Tibial and femoral trials were inserted followed by insertion of a 10 mm polyethylene insert. This allowed for excellent mediolateral soft tissue balancing both in flexion and in full extension. Finally, the patella was cut and prepared so as to accommodate a 35 mm medialized dome patella. A patella trial was placed and the knee was placed through a range of motion with excellent patellar tracking appreciated. The femoral trial was removed after debridement of posterior osteophytes. The central post-hole for the tibial component was reamed followed by insertion of a keel punch. Tibial trials were then removed. Cut surfaces of bone were irrigated with copious amounts of normal saline using pulsatile lavage and then suctioned dry. Polymethylmethacrylate cement with gentamicin was prepared in the usual fashion using a vacuum mixer. Cement was applied to the cut surface of the proximal tibia as well as along the undersurface of a size 5 rotating platform tibial component. Tibial component was positioned and impacted into place. Excess cement was removed using [employer] fast streamer. Cement was then applied to the cut surfaces of the femur as well as along the posterior flanges of the size 4 femoral component. The femoral component was positioned and impacted into place. Excess cement was removed using [employer] fast streamer. A 10 mm polyethylene trial was inserted and the knee was brought into full extension with steady axial compression applied. Finally, cement  was applied to the backside of a 35 mm medialized dome patella and the patellar component was positioned and patellar clamp applied. Excess cement was removed using [employer] fast streamer. After adequate curing of the cement, the tourniquet was deflated after a total tourniquet time of 102 minutes. Hemostasis was achieved using electrocautery. The knee was irrigated with copious amounts of normal saline using pulsatile lavage followed by 500 ml of Surgiphor and then suctioned dry. 20 mL of 1.3% Exparel and 60 mL of 0.25% Marcaine in 40 mL of normal saline was injected along the posterior capsule, medial and lateral gutters, and along the arthrotomy site. A 10 mm stabilized rotating platform polyethylene insert was inserted and the knee was placed through a range of motion with excellent mediolateral soft tissue balancing appreciated and excellent patellar tracking noted. 2 medium drains were placed in the wound bed and brought out through separate stab incisions. The medial parapatellar portion of the incision was reapproximated using interrupted sutures of #1 Vicryl. Subcutaneous tissue was approximated in layers using first #0 Vicryl followed #2-0 Vicryl. The skin was approximated with skin staples. A sterile dressing was applied.  The patient tolerated the procedure well and was transported to the recovery room in stable condition.    Alfonza Toft P. Holley Bouche., M.D.

## 2020-08-30 NOTE — Anesthesia Preprocedure Evaluation (Signed)
Anesthesia Evaluation  Patient identified by MRN, date of birth, ID band Patient awake    Reviewed: Allergy & Precautions, NPO status , Patient's Chart, lab work & pertinent test results  History of Anesthesia Complications (+) PONV, Family history of anesthesia reaction and history of anesthetic complications  Airway Mallampati: II  TM Distance: >3 FB Neck ROM: Full    Dental  (+) Dental Advidsory Given, Teeth Intact   Pulmonary neg pulmonary ROS, neg sleep apnea, neg COPD,    breath sounds clear to auscultation- rhonchi (-) wheezing      Cardiovascular Exercise Tolerance: Good (-) hypertension(-) angina(-) CAD, (-) Past MI, (-) Cardiac Stents and (-) CABG  Rhythm:Regular Rate:Normal - Systolic murmurs and - Diastolic murmurs    Neuro/Psych  Headaches, neg Seizures negative psych ROS   GI/Hepatic Neg liver ROS, GERD  ,  Endo/Other  neg diabetesHypothyroidism   Renal/GU negative Renal ROS     Musculoskeletal  (+) Arthritis ,   Abdominal (+) - obese,   Peds  Hematology negative hematology ROS (+)   Anesthesia Other Findings Past Medical History: No date: Arthritis     Comment:  hands, knees No date: Bone spur 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 No date: Family history of adverse reaction to anesthesia     Comment:  Mother - PONV No date: GERD (gastroesophageal reflux disease) No date: Headache     Comment:  migraine - couple times per year No date: Heart palpitations No date: History of kidney stones No date: Hyperlipemia No date: Insomnia No date: Osteopenia No date: PONV (postoperative nausea and vomiting) No date: Vaginal atrophy   Reproductive/Obstetrics                             Anesthesia Physical  Anesthesia Plan  ASA: II  Anesthesia Plan: Spinal   Post-op Pain Management:  Regional for Post-op pain   Induction: Intravenous  PONV Risk Score and Plan: 3 and Propofol  infusion, TIVA and Treatment may vary due to age or medical condition  Airway Management Planned: Natural Airway and Simple Face Mask  Additional Equipment:   Intra-op Plan:   Post-operative Plan: Extubation in OR  Informed Consent: I have reviewed the patients History and Physical, chart, labs and discussed the procedure including the risks, benefits and alternatives for the proposed anesthesia with the patient or authorized representative who has indicated his/her understanding and acceptance.     Dental advisory given  Plan Discussed with: CRNA and Anesthesiologist  Anesthesia Plan Comments:         Anesthesia Quick Evaluation

## 2020-08-30 NOTE — Progress Notes (Signed)
Order Requisition for Advanced Directive and prayer. Both were completed. Patient has packet and asked to complete so staff can contact chaplain to facilitate notary.

## 2020-08-30 NOTE — H&P (Signed)
The patient has been re-examined, and the chart reviewed, and there have been no interval changes to the documented history and physical.    The risks, benefits, and alternatives have been discussed at length. The patient expressed understanding of the risks benefits and agreed with plans for surgical intervention.  Julianny Milstein P. Toneshia Coello, Jr. M.D.    

## 2020-08-30 NOTE — Anesthesia Procedure Notes (Addendum)
Spinal  Patient location during procedure: OR Start time: 08/30/2020 7:20 AM End time: 08/30/2020 7:30 AM Reason for block: surgical anesthesia Staffing Performed: resident/CRNA  Resident/CRNA: Nelda Marseille, CRNA Preanesthetic Checklist Completed: patient identified, IV checked, site marked, risks and benefits discussed, surgical consent, monitors and equipment checked, pre-op evaluation and timeout performed Spinal Block Patient position: sitting Prep: Betadine Patient monitoring: heart rate, continuous pulse ox, blood pressure and cardiac monitor Approach: right paramedian Location: L3-4 Injection technique: single-shot Needle Needle type: Whitacre and Introducer  Needle gauge: 25 G Needle length: 9 cm Assessment Sensory level: T10 Events: CSF return Additional Notes Negative paresthesia. Negative blood return. Positive free-flowing CSF. Expiration date of kit checked and confirmed. Patient tolerated procedure well, without complications.

## 2020-08-30 NOTE — Plan of Care (Signed)
  Problem: Education: Goal: Knowledge of General Education information will improve Description: Including pain rating scale, medication(s)/side effects and non-pharmacologic comfort measures Outcome: Progressing   Problem: Health Behavior/Discharge Planning: Goal: Ability to manage health-related needs will improve Outcome: Progressing   Problem: Clinical Measurements: Goal: Ability to maintain clinical measurements within normal limits will improve Outcome: Progressing Goal: Will remain free from infection Outcome: Progressing Goal: Diagnostic test results will improve Outcome: Progressing Goal: Respiratory complications will improve Outcome: Progressing Goal: Cardiovascular complication will be avoided Outcome: Progressing   Problem: Activity: Goal: Risk for activity intolerance will decrease Outcome: Progressing   Problem: Nutrition: Goal: Adequate nutrition will be maintained Outcome: Progressing   Problem: Elimination: Goal: Will not experience complications related to bowel motility Outcome: Progressing Goal: Will not experience complications related to urinary retention Outcome: Progressing   Problem: Pain Managment: Goal: General experience of comfort will improve Outcome: Progressing   Problem: Safety: Goal: Ability to remain free from injury will improve Outcome: Progressing   Problem: Education: Goal: Knowledge of the prescribed therapeutic regimen will improve Outcome: Progressing Goal: Individualized Educational Video(s) Outcome: Progressing   Problem: Skin Integrity: Goal: Risk for impaired skin integrity will decrease Outcome: Progressing   Problem: Activity: Goal: Ability to avoid complications of mobility impairment will improve Outcome: Progressing Goal: Range of joint motion will improve Outcome: Progressing   Problem: Pain Management: Goal: Pain level will decrease with appropriate interventions Outcome: Progressing   Problem: Skin  Integrity: Goal: Will show signs of wound healing Outcome: Progressing

## 2020-08-30 NOTE — Transfer of Care (Signed)
Immediate Anesthesia Transfer of Care Note  Patient: Cindy Carpenter  Procedure(s) Performed: COMPUTER ASSISTED TOTAL KNEE ARTHROPLASTY (Left Knee)  Patient Location: PACU  Anesthesia Type:Spinal  Level of Consciousness: awake, alert  and oriented  Airway & Oxygen Therapy: Patient Spontanous Breathing and Patient connected to nasal cannula oxygen  Post-op Assessment: Report given to RN and Post -op Vital signs reviewed and stable  Post vital signs: Reviewed and stable  Last Vitals:  Vitals Value Taken Time  BP 119/73 08/30/20 1122  Temp    Pulse 96 08/30/20 1124  Resp 11 08/30/20 1124  SpO2 92 % 08/30/20 1124  Vitals shown include unvalidated device data.  Last Pain:  Vitals:   08/30/20 0636  TempSrc: Temporal  PainSc: 2          Complications: No complications documented.

## 2020-08-30 NOTE — Anesthesia Procedure Notes (Signed)
Date/Time: 08/30/2020 8:00 AM Performed by: Nelda Marseille, CRNA Pre-anesthesia Checklist: Patient identified, Emergency Drugs available, Suction available, Patient being monitored and Timeout performed Oxygen Delivery Method: Simple face mask

## 2020-08-31 ENCOUNTER — Encounter: Payer: Self-pay | Admitting: Orthopedic Surgery

## 2020-08-31 MED ORDER — CELECOXIB 200 MG PO CAPS: 200.0000 mg | ORAL_CAPSULE | Freq: Two times a day (BID) | ORAL | 0 refills | Status: DC

## 2020-08-31 MED ORDER — TRAZODONE HCL 100 MG PO TABS: 100.0000 mg | ORAL_TABLET | Freq: Every day | ORAL | Status: DC

## 2020-08-31 MED ORDER — OXYCODONE HCL 5 MG PO TABS: 5.0000 mg | ORAL_TABLET | ORAL | 0 refills | Status: DC | PRN

## 2020-08-31 MED ORDER — ENOXAPARIN SODIUM 40 MG/0.4ML IJ SOSY: 40.0000 mg | PREFILLED_SYRINGE | INTRAMUSCULAR | 0 refills | Status: DC

## 2020-08-31 NOTE — Evaluation (Signed)
Occupational Therapy Evaluation Patient Details Name: Cindy Carpenter MRN: [id number] DOB: [birth year] Today's Date: 08/31/2020    History of Present Illness Pt is a 65 yo F diagnosed with degenerative arthrosis of the left knee and is s/p elective L TKA and hardware removal (bone staples) from prior left ACL reconstruction.  PMH includes: hypothyroidism, GERD, osteopenia, and lumbar radiculopathy.   Clinical Impression   Pt seen for OT evaluation this date in setting of acute hospitalization s/p L TKA. Pt presents this date in good spirits and motivated to get up and moving. Pt reports being INDEP at baseline with all ADLs and mobility. Pt with some minimal L knee pain and some decreased standing tolerance impacting her ability to efficiently complete ADLs at this time. OT educates re: modification techniques for LB ADLs as well as polar care and compression stocking mgt to address pain, prevention of edema and prevention of clots. Pt and spouse with good understanding of mgt (including wear time recommendations) and handout issued. On OT ADL assessment this date, pt able to perform ADL transfers with SUPV and fxl mobility to/from restroom with RW with SUPV. Pt requires only one cue for hand placement and demos correct sequence for all subsequent transfers performed this session. Pt requires MIN A for LB dressing initially, but OT educates re: modified, bend-at-waist technique and she is then able to complete threading underwear with MOD I. All education completed and pt does not demo any further skilled OT needs acutely, nor need for f/u upon d/c as she successfully returns demonstration. Will complete OT order at this time.    Follow Up Recommendations  No OT follow up    Equipment Recommendations  Tub/shower seat    Recommendations for Other Services       Precautions / Restrictions Precautions Precautions: Fall Restrictions Weight Bearing Restrictions: Yes LLE Weight Bearing: Weight  bearing as tolerated Other Position/Activity Restrictions: No KI required, pt able to perform Ind LLE SLR without extensor lag.      Mobility Bed Mobility Overal bed mobility: Modified Independent             General bed mobility comments: extra time    Transfers Overall transfer level: Needs assistance Equipment used: Rolling walker (2 wheeled) Transfers: Sit to/from Stand Sit to Stand: Supervision         General transfer comment: one cue for hand placement on initial stand, but pt able to demo correct/safe use of RW for all subsequent transfers peformed this date including to/from commode.    Balance Overall balance assessment: Needs assistance   Sitting balance-Leahy Scale: Normal     Standing balance support: During functional activity;Bilateral upper extremity supported Standing balance-Leahy Scale: Good Standing balance comment: benefits from UE support with fxl mobility, but for static standing, able to take b/l hands off walker and alternate them while standing sink-side to perform grooming tasks inlucindg oral care.                           ADL either performed or assessed with clinical judgement   ADL Overall ADL's : Modified independent                                       General ADL Comments: requires modified LB dressing technique education to thread underwear and socks to L LE, but overall able to perform  without physical assistance from OT. Beyone educating re: modifications post-op'ly, pt safely and reasonably able to execute all self care assesed this date.     Vision Patient Visual Report: No change from baseline       Perception     Praxis      Pertinent Vitals/Pain Pain Assessment: 0-10 Pain Score: 3  Pain Location: L knee Pain Descriptors / Indicators: Sore Pain Intervention(s): Monitored during session;Repositioned     Hand Dominance     Extremity/Trunk Assessment Upper Extremity Assessment Upper  Extremity Assessment: Overall WFL for tasks assessed   Lower Extremity Assessment Lower Extremity Assessment: LLE deficits/detail LLE Deficits / Details: some expected limited knee ROM post-op'ly, but overall functional as it pertains to ADL performance. LLE: Unable to fully assess due to pain LLE Sensation: WNL LLE Coordination: WNL       Communication Communication Communication: No difficulties   Cognition Arousal/Alertness: Awake/alert Behavior During Therapy: WFL for tasks assessed/performed Overall Cognitive Status: Within Functional Limits for tasks assessed                                     General Comments       Exercises Total Joint Exercises Quad Sets: AROM;Strengthening;Left;5 reps;10 reps Long Arc Quad: AROM;Strengthening;Left;5 reps;10 reps Knee Flexion: 10 reps;5 reps;Left;Strengthening;AROM Goniometric ROM: L knee AROM: 4-81 deg Other Exercises Other Exercises: OT educates re: polar care, compression stocking mgt, safety with RW for ADL transfers. Pt with good carryover.   Shoulder Instructions      Home Living Family/patient expects to be discharged to:: Private residence Living Arrangements: Spouse/significant other Available Help at Discharge: Family;Available 24 hours/day Type of Home: House Home Access: Stairs to enter [employer] of Steps: 1 small threshold Entrance Stairs-Rails: None Home Layout: Two level;Able to live on main level with bedroom/bathroom     Bathroom Shower/Tub: Occupational psychologist: Standard     Home Equipment: Environmental consultant - 2 wheels;Other (comment);Shower seat   Additional Comments: Owns upright bilateral platform walker      Prior Functioning/Environment Level of Independence: Independent        Comments: Ind amb community distances without an AD, no fall history, Ind with ADLs, works PRN as Special educational needs teacher        OT Problem List: Decreased range of motion;Decreased activity  tolerance;Pain      OT Treatment/Interventions: Self-care/ADL training;Therapeutic activities    OT Goals(Current goals can be found in the care plan section) Acute Rehab OT Goals Patient Stated Goal: to be able to walk w/o pain in L LE. OT Goal Formulation: All assessment and education complete, DC therapy  OT Frequency:     Barriers to D/C:            Co-evaluation              AM-PAC OT "6 Clicks" Daily Activity     Outcome Measure Help from another person eating meals?: None Help from another person taking care of personal grooming?: None Help from another person toileting, which includes using toliet, bedpan, or urinal?: None Help from another person bathing (including washing, rinsing, drying)?: A Little Help from another person to put on and taking off regular upper body clothing?: None Help from another person to put on and taking off regular lower body clothing?: None 6 Click Score: 23   End of Session Equipment Utilized During Treatment: Rolling walker  Nurse Communication: Mobility status  Activity Tolerance: Patient tolerated treatment well Patient left: in chair;with call bell/phone within reach;with family/visitor present  OT Visit Diagnosis: Pain Pain - Right/Left: Left Pain - part of body: Knee                Time: 8978-4784 OT Time Calculation (min): 34 min Charges:  OT General Charges $OT Visit: 1 Visit OT Evaluation $OT Eval Low Complexity: 1 Low OT Treatments $Self Care/Home Management : 8-22 mins $Therapeutic Activity: 8-22 mins  Gerrianne Scale, MS, OTR/L ascom [phone] 08/31/20, 3:13 PM

## 2020-08-31 NOTE — Progress Notes (Signed)
Physical Therapy Treatment Patient Details Name: Cindy Carpenter MRN: [id number] DOB: November 06, 1955 Today's Date: 08/31/2020    History of Present Illness Pt is a 65 yo F diagnosed with degenerative arthrosis of the left knee and is s/p elective L TKA and hardware removal (bone staples) from prior left ACL reconstruction.  PMH includes: hypothyroidism, GERD, osteopenia, and lumbar radiculopathy.    PT Comments    Pt was pleasant and motivated to participate during the session and continued to make very good progress towards goals.  Pt was steady with transfers, gait, and stair training. Pt was able to ascend steps with B rails as well as with a RW with very good stability and carryover of proper sequencing.  Pt reported no adverse symptoms during the session other than mild L knee pain.  Pt will benefit from HHPT upon discharge to safely address deficits listed in patient problem list for decreased caregiver assistance and eventual return to PLOF.    Follow Up Recommendations  Home health PT;Supervision for mobility/OOB     Equipment Recommendations  3in1 (PT)    Recommendations for Other Services       Precautions / Restrictions Precautions Precautions: Fall Restrictions Weight Bearing Restrictions: Yes LLE Weight Bearing: Weight bearing as tolerated Other Position/Activity Restrictions: No KI required, pt able to perform Ind LLE SLR without extensor lag.    Mobility  Bed Mobility Overal bed mobility: Modified Independent             General bed mobility comments: Min extra time and effort only    Transfers Overall transfer level: Needs assistance Equipment used: Rolling walker (2 wheeled) Transfers: Sit to/from Stand Sit to Stand: Supervision         General transfer comment: Min verbal and visual cues for sequencing for hand and L foot placement  Ambulation/Gait Ambulation/Gait assistance: Supervision Gait Distance (Feet): 200 Feet Assistive device: Rolling  walker (2 wheeled) Gait Pattern/deviations: Decreased stance time - left;Decreased step length - right;Step-through pattern Gait velocity: decreased   General Gait Details: Pt steady with ambulation with no LOB or L knee buckling with step-through pattern and very good cadence   Stairs Stairs: Yes Stairs assistance: Min guard Stair Management: Two rails;Step to pattern;Backwards;Forwards;With walker Number of Stairs: 4 steps x 1, 1 step x 2  General stair comments: Practice ascending/descending 4 steps with B rails and one step with RW with pt demonstrating good control and stability as well as carryover of proper sequencing   Wheelchair Mobility    Modified Rankin (Stroke Patients Only)       Balance Overall balance assessment: Needs assistance   Sitting balance-Leahy Scale: Normal     Standing balance support: During functional activity;Bilateral upper extremity supported Standing balance-Leahy Scale: Good Standing balance comment: Min lean on the RW for support                            Cognition Arousal/Alertness: Awake/alert Behavior During Therapy: WFL for tasks assessed/performed Overall Cognitive Status: Within Functional Limits for tasks assessed                                        Exercises Total Joint Exercises Quad Sets: AROM;Strengthening;Left;5 reps;10 reps Long Arc Quad: AROM;Strengthening;Left;5 reps;10 reps Knee Flexion: 10 reps;5 reps;Left;Strengthening;AROM Goniometric ROM: L knee AROM: 4-81 deg Other Exercises Other Exercises: Car transfer  sequencing verbal education and visual simulated demonstration    General Comments        Pertinent Vitals/Pain Pain Assessment: 0-10 Pain Score: 1  Pain Location: L knee Pain Descriptors / Indicators: Sore Pain Intervention(s): Premedicated before session;Monitored during session    Home Living                      Prior Function            PT Goals  (current goals can now be found in the care plan section) Progress towards PT goals: Progressing toward goals    Frequency    BID      PT Plan Current plan remains appropriate    Co-evaluation              AM-PAC PT "6 Clicks" Mobility   Outcome Measure  Help needed turning from your back to your side while in a flat bed without using bedrails?: A Little Help needed moving from lying on your back to sitting on the side of a flat bed without using bedrails?: A Little Help needed moving to and from a bed to a chair (including a wheelchair)?: A Little Help needed standing up from a chair using your arms (e.g., wheelchair or bedside chair)?: A Little Help needed to walk in hospital room?: A Little Help needed climbing 3-5 steps with a railing? : A Little 6 Click Score: 18    End of Session Equipment Utilized During Treatment: Gait belt Activity Tolerance: Patient tolerated treatment well Patient left: in bed;with call bell/phone within reach;with bed alarm set;with SCD's reapplied;Other (comment) (polar care donned to L knee) Nurse Communication: Mobility status;Weight bearing status PT Visit Diagnosis: Other abnormalities of gait and mobility (R26.89);Muscle weakness (generalized) (M62.81);Pain Pain - Right/Left: Left Pain - part of body: Knee     Time: 3557-3220 PT Time Calculation (min) (ACUTE ONLY): 40 min  Charges:  $Gait Training: 23-37 mins $Therapeutic Activity: 8-22 mins                     D. Scott Davontae Prusinski PT, DPT 08/31/20, 12:54 PM

## 2020-08-31 NOTE — Progress Notes (Signed)
Physical Therapy Treatment Patient Details Name: Cindy Carpenter MRN: [id number] DOB: Sep 02, 1955 Today's Date: 08/31/2020    History of Present Illness Pt is a 65 yo F diagnosed with degenerative arthrosis of the left knee and is s/p elective L TKA and hardware removal (bone staples) from prior left ACL reconstruction.  PMH includes: hypothyroidism, GERD, osteopenia, and lumbar radiculopathy.    PT Comments    Pt was pleasant and motivated to participate during the session.  Pt with limited session secondary to potential discharge this PM but pt reported desire to ambulate prior to discharge.  Pt was steady with ambulation with very good cadence and bilateral step length.  Pt reported no adverse symptoms during the session including no L knee pain.  Pt will benefit from HHPT upon discharge to safely address deficits listed in patient problem list for decreased caregiver assistance and eventual return to PLOF.     Follow Up Recommendations  Home health PT;Supervision for mobility/OOB     Equipment Recommendations  3in1 (PT)    Recommendations for Other Services       Precautions / Restrictions Precautions Precautions: Fall Restrictions Weight Bearing Restrictions: Yes LLE Weight Bearing: Weight bearing as tolerated Other Position/Activity Restrictions: No KI required, pt able to perform Ind LLE SLR without extensor lag.    Mobility  Bed Mobility Overal bed mobility: Independent             General bed mobility comments: Good speed and effort    Transfers Overall transfer level: Needs assistance Equipment used: Rolling walker (2 wheeled) Transfers: Sit to/from Stand Sit to Stand: Supervision         General transfer comment: Very good eccentric and concentric control and stability  Ambulation/Gait Ambulation/Gait assistance: Supervision Gait Distance (Feet): 200 Feet Assistive device: Rolling walker (2 wheeled) Gait Pattern/deviations: WFL(Within Functional  Limits) Gait velocity: decreased   General Gait Details: Equal step length bilaterally with very good cadence and no instability or buckling   Stairs Stairs: Yes Stairs assistance: Min guard Stair Management: Two rails;Step to pattern;Backwards;Forwards;With walker Number of Stairs: 4 General stair comments: Practice ascending/descending 4 steps with B rails and one step with RW with pt demonstrating good control and stability as well as carryover of proper sequencing   Wheelchair Mobility    Modified Rankin (Stroke Patients Only)       Balance Overall balance assessment: Needs assistance   Sitting balance-Leahy Scale: Normal     Standing balance support: During functional activity;Bilateral upper extremity supported Standing balance-Leahy Scale: Good Standing balance comment: Very little lean on the RW with BUE's; steady during amb with start/stops and change of direction                            Cognition Arousal/Alertness: Awake/alert Behavior During Therapy: WFL for tasks assessed/performed Overall Cognitive Status: Within Functional Limits for tasks assessed                                        Exercises Total Joint Exercises Quad Sets: AROM;Strengthening;Left;5 reps;10 reps Long Arc Quad: AROM;Strengthening;Left;10 reps Knee Flexion: 10 reps;Left;Strengthening;AROM Goniometric ROM: L knee AROM: 4-81 deg Other Exercises Other Exercises: OT educates re: polar care, compression stocking mgt, safety with RW for ADL transfers. Pt with good carryover.    General Comments        Pertinent  Vitals/Pain Pain Assessment: No/denies pain Pain Score: 3  Pain Location: L knee Pain Descriptors / Indicators: Sore Pain Intervention(s): Monitored during session;Repositioned    Home Living Family/patient expects to be discharged to:: Private residence Living Arrangements: Spouse/significant other Available Help at Discharge:  Family;Available 24 hours/day Type of Home: House Home Access: Stairs to enter Entrance Stairs-Rails: None Home Layout: Two level;Able to live on main level with bedroom/bathroom Home Equipment: Gilford Rile - 2 wheels;Other (comment);Shower seat Additional Comments: Owns upright bilateral platform walker    Prior Function Level of Independence: Independent      Comments: Ind amb community distances without an AD, no fall history, Ind with ADLs, works PRN as cardiac rehab RN   PT Goals (current goals can now be found in the care plan section) Acute Rehab PT Goals Patient Stated Goal: to be able to walk w/o pain in L LE. Progress towards PT goals: Progressing toward goals    Frequency    BID      PT Plan Current plan remains appropriate    Co-evaluation              AM-PAC PT "6 Clicks" Mobility   Outcome Measure  Help needed turning from your back to your side while in a flat bed without using bedrails?: A Little Help needed moving from lying on your back to sitting on the side of a flat bed without using bedrails?: A Little Help needed moving to and from a bed to a chair (including a wheelchair)?: A Little Help needed standing up from a chair using your arms (e.g., wheelchair or bedside chair)?: A Little Help needed to walk in hospital room?: A Little Help needed climbing 3-5 steps with a railing? : A Little 6 Click Score: 18    End of Session Equipment Utilized During Treatment: Gait belt Activity Tolerance: Patient tolerated treatment well Patient left: in bed;with call bell/phone within reach;with bed alarm set;with SCD's reapplied;Other (comment);with family/visitor present (Polar care to L knee) Nurse Communication: Mobility status;Weight bearing status PT Visit Diagnosis: Other abnormalities of gait and mobility (R26.89);Muscle weakness (generalized) (M62.81);Pain Pain - Right/Left: Left Pain - part of body: Knee     Time: 1610-9604 PT Time Calculation (min)  (ACUTE ONLY): 20 min  Charges:  $Gait Training: 8-22 mins $Therapeutic Activity: 8-22 mins                     D. Scott Crist Kruszka PT, DPT 08/31/20, 4:25 PM

## 2020-08-31 NOTE — Discharge Summary (Signed)
Physician Discharge Summary  Patient ID: Cindy Carpenter MRN: [id number] DOB/AGE: [birth year] 65 y.o.  Admit date: 08/30/2020 Discharge date: 08/31/2020  Admission Diagnoses:  Total knee replacement status [Z96.659]  Surgeries:Procedure(s):  Left total knee arthroplasty using computer-assisted navigation Hardware removal (bone staples x2)  SURGEON:  Marciano Sequin. M.D.  ASSISTANT: Cassell Smiles, PA-C (present and scrubbed throughout the case, critical for assistance with exposure, retraction, instrumentation, and closure)  ANESTHESIA: spinal  ESTIMATED BLOOD LOSS: 50 mL  FLUIDS REPLACED: 1400 mL of crystalloid  TOURNIQUET TIME: 102 minutes  DRAINS: 2 medium Hemovac drains  SOFT TISSUE RELEASES: Anterior cruciate ligament, posterior cruciate ligament, deep medial collateral ligament, patellofemoral ligament  IMPLANTS UTILIZED: DePuy Attune size 4 posterior stabilized femoral component (cemented), size 5 rotating platform tibial component (cemented), 35 mm medialized dome patella (cemented), and a 10 mm stabilized rotating platform polyethylene insert.  Discharge Diagnoses: Patient Active Problem List   Diagnosis Date Noted  . Total knee replacement status 08/30/2020  . Chronic insomnia 11/22/2019  . History of pneumonia 11/22/2019  . Pelvic pain in female 11/22/2019  . Acquired hypothyroidism 11/09/2019  . Other and unspecified hyperlipidemia 06/20/2019  . Abnormal glucose 08/26/2018  . Benign neoplasm of ascending colon 08/28/2017  . Obesity (BMI 30.0-34.9) 10/22/2016  . Trigger middle finger of right hand 10/22/2016  . History of colon polyps   . Benign neoplasm of cecum   . Polyp of sigmoid colon   . Benign neoplasm of transverse colon   . GERD (gastroesophageal reflux disease) 08/02/2016  . Osteoarthritis of multiple joints 08/02/2016  . Osteopenia 08/02/2016  . Pure hypercholesterolemia 08/02/2016  . Primary osteoarthritis of left knee 07/16/2016  .  Lumbar radiculopathy 01/05/2015  . Bursitis of hip 11/03/2014  . Pain in right hip 11/03/2014  . Complete rotator cuff rupture of left shoulder 04/05/2014  . Chronic cervical pain 03/01/2014  . Pain in shoulder 03/01/2014  . Impingement syndrome of left shoulder 02/22/2014  . Impingement syndrome of shoulder 02/22/2014    Past Medical History:  Diagnosis Date  . Arthritis    hands, knees  . Bone spur   . DES exposure in utero   . Family history of adverse reaction to anesthesia    Mother - PONV  . GERD (gastroesophageal reflux disease)   . Headache    migraine - couple times per year  . Heart palpitations   . History of kidney stones   . Hyperlipemia   . Insomnia   . Osteopenia   . PONV (postoperative nausea and vomiting)    scopolamine has worked well in the past  . Vaginal atrophy      Transfusion:    Consultants (if any):   Discharged Condition: Improved  Hospital Course: Cindy Carpenter is an 65 y.o. female who was admitted 08/30/2020 with a diagnosis of left knee osteoarthritis, primary retained hardware (bone staples) s/p left ACL reconstruction and went to the operating room on 08/30/2020 and underwent left total knee arthoplasty, hardware removal (bone staples x2). The patient received perioperative antibiotics for prophylaxis (see below). The patient tolerated the procedure well and was transported to PACU in stable condition. After meeting PACU criteria, the patient was subsequently transferred to the Orthopaedics/Rehabilitation unit.   The patient received DVT prophylaxis in the form of early mobilization, Lovenox, Foot Pumps and TED hose. A sacral pad had been placed and heels were elevated off of the bed with rolled towels in order to protect skin integrity. Foley catheter was  discontinued on postoperative day #0. Wound drains were discontinued on postoperative day #2. The surgical incision was healing well without signs of infection.  Physical therapy was  initiated postoperatively for transfers, gait training, and strengthening. Occupational therapy was initiated for activities of daily living and evaluation for assisted devices. Rehabilitation goals were reviewed in detail with the patient. The patient made steady progress with physical therapy and physical therapy recommended discharge to Home.   The patient achieved the preliminary goals of this hospitalization and was felt to be medically and orthopaedically appropriate for discharge.  She was given perioperative antibiotics:  Anti-infectives (From admission, onward)   Start     Dose/Rate Route Frequency Ordered Stop   08/30/20 1600  ceFAZolin (ANCEF) IVPB 2g/100 mL premix        2 g 200 mL/hr over 30 Minutes Intravenous Every 6 hours 08/30/20 1416 08/30/20 2235   08/30/20 0629  ceFAZolin (ANCEF) 2-4 GM/100ML-% IVPB       Note to Pharmacy: Trudie Reed   : cabinet override      08/30/20 0629 08/30/20 0748   08/30/20 0600  ceFAZolin (ANCEF) IVPB 2g/100 mL premix        2 g 200 mL/hr over 30 Minutes Intravenous On call to O.R. 08/29/20 2239 08/30/20 2878    .  Recent vital signs:  Vitals:   08/31/20 0803 08/31/20 1558  BP: 99/72 116/73  Pulse: 87 98  Resp: 15 16  Temp: 98 F (36.7 C) 98.1 F (36.7 C)  SpO2: 99% 98%    Recent laboratory studies:  No results for input(s): WBC, HGB, HCT, PLT, K, CL, CO2, BUN, CREATININE, GLUCOSE, CALCIUM, LABPT, INR in the last 72 hours.  Diagnostic Studies: DG Ribs Unilateral W/Chest Right  Result Date: 08/23/2020 CLINICAL DATA:  RIGHT anterolateral rib pain for 1 week. EXAM: RIGHT RIBS AND CHEST - 3+ VIEW COMPARISON:  None. FINDINGS: Normal mediastinum and cardiac silhouette. Normal pulmonary vasculature. No evidence of effusion, infiltrate, or pneumothorax. No acute bony abnormality. Dedicated views of the ribs demonstrate no fracture. No pneumothorax. IMPRESSION: 1.  No acute cardiopulmonary process. 2. No evidence rib fracture or  pneumothorax.  Electronically Signed   By: Suzy Bouchard M.D.   On: 08/23/2020 10:31   DG Knee Left Port  Result Date: 08/30/2020 CLINICAL DATA:  Status post left knee replacement EXAM: PORTABLE LEFT KNEE - 2 VIEW COMPARISON:  None. FINDINGS: Left knee prosthesis is noted in satisfactory position. Surgical drain is seen in place. No acute bony or soft tissue abnormality is noted. IMPRESSION: Status post left knee replacement. Electronically Signed   By: Inez Catalina M.D.   On: 08/30/2020 12:24    Discharge Medications:   Allergies as of 08/31/2020      Reactions   Morphine Nausea Only   Other Anaphylaxis   IVP contrast dye   Penicillin G Anaphylaxis   Iodinated Diagnostic Agents Swelling   Codeine Nausea Only   Shellfish Allergy Swelling      Medication List    TAKE these medications   acetaminophen 500 MG tablet Commonly known as: TYLENOL Take 2 tablets (1,000 mg total) by mouth every 8 (eight) hours.   buPROPion 300 MG 24 hr tablet Commonly known as: WELLBUTRIN XL Take 300 mg by mouth daily.   butalbital-aspirin-caffeine 50-325-40 MG capsule Commonly known as: FIORINAL Take 1 capsule by mouth every 4 (four) hours as needed (migraines.).   CALCIUM + D3 PO Take 1 tablet by mouth in the morning and  at bedtime.   celecoxib 200 MG capsule Commonly known as: CELEBREX Take 1 capsule (200 mg total) by mouth 2 (two) times daily.   DULoxetine 30 MG capsule Commonly known as: CYMBALTA Take 30 mg by mouth daily.   enoxaparin 40 MG/0.4ML injection Commonly known as: LOVENOX Inject 0.4 mLs (40 mg total) into the skin daily for 14 days.   fluticasone 50 MCG/ACT nasal spray Commonly known as: FLONASE Place 1-2 sprays into both nostrils daily as needed (allergies.).   levothyroxine 50 MCG tablet Commonly known as: SYNTHROID Take 50 mcg by mouth daily before breakfast.   omeprazole 10 MG capsule Commonly known as: PRILOSEC Take 10 mg by mouth daily.   oxyCODONE 5 MG immediate release  tablet Commonly known as: Oxy IR/ROXICODONE Take 1 tablet (5 mg total) by mouth every 4 (four) hours as needed for moderate pain (pain score 4-6).   raloxifene 60 MG tablet Commonly known as: EVISTA Take 60 mg by mouth daily.   simvastatin 20 MG tablet Commonly known as: ZOCOR Take 20 mg by mouth at bedtime.   solifenacin 10 MG tablet Commonly known as: VESICARE Take 10 mg by mouth daily.   traZODone 50 MG tablet Commonly known as: DESYREL Take 50-100 mg by mouth at bedtime.            Durable Medical Equipment  (From admission, onward)         Start     Ordered   08/30/20 1417  DME Walker rolling  Once       Question:  Patient needs a walker to treat with the following condition  Answer:  Total knee replacement status   08/30/20 1416   08/30/20 1417  DME Bedside commode  Once       Question:  Patient needs a bedside commode to treat with the following condition  Answer:  Total knee replacement status   08/30/20 1416          Disposition: Home with home health PT     Follow-up Information    [facility] Heir On 09/14/2020.   Specialty: Orthopedic Surgery Why: at 1:15pm Contact information: [place] [address] [phone]        Dereck Leep, MD On 10/17/2020.   Specialty: Orthopedic Surgery Why: at 2:30pm Contact information: [place] [address] [place], PA-C 08/31/2020, 6:01 PM

## 2020-08-31 NOTE — Anesthesia Postprocedure Evaluation (Signed)
Anesthesia Post Note  Patient: Cindy Carpenter  Procedure(s) Performed: COMPUTER ASSISTED TOTAL KNEE ARTHROPLASTY (Left Knee)  Patient location during evaluation: Nursing Unit Anesthesia Type: Spinal Level of consciousness: oriented and awake and alert Pain management: pain level controlled Vital Signs Assessment: post-procedure vital signs reviewed and stable Respiratory status: spontaneous breathing and respiratory function stable Cardiovascular status: blood pressure returned to baseline and stable Postop Assessment: no headache, no backache, no apparent nausea or vomiting and patient able to bend at knees Anesthetic complications: no   No complications documented.   Last Vitals:  Vitals:   08/30/20 1946 08/31/20 0504  BP: (!) 111/50 101/62  Pulse: 97 94  Resp: 16 16  Temp: 36.8 C 36.7 C  SpO2: 95% 99%    Last Pain:  Vitals:   08/31/20 0730  TempSrc:   PainSc: 4                  Nyaisha Simao

## 2020-08-31 NOTE — Progress Notes (Signed)
  Subjective: 1 Day Post-Op Procedure(s) (LRB): COMPUTER ASSISTED TOTAL KNEE ARTHROPLASTY (Left) Patient reports pain as well-controlled.   Patient is well, and has had no acute complaints or problems Plan is to go Home after hospital stay. Negative for chest pain and shortness of breath Fever: no Gastrointestinal: negative for nausea and vomiting.  Patient has not had a bowel movement.  Objective: Vital signs in last 24 hours: Temp:  [97.6 F (36.4 C)-98.5 F (36.9 C)] 98 F (36.7 C) (05/26 0504) Pulse Rate:  [78-103] 94 (05/26 0504) Resp:  [9-24] 16 (05/26 0504) BP: (99-129)/(50-86) 101/62 (05/26 0504) SpO2:  [93 %-100 %] 99 % (05/26 0504)  Intake/Output from previous day:  Intake/Output Summary (Last 24 hours) at 08/31/2020 0804 Last data filed at 08/31/2020 0624 Gross per 24 hour  Intake 3262.11 ml  Output 1550 ml  Net 1712.11 ml    Intake/Output this shift: No intake/output data recorded.  Labs: No results for input(s): HGB in the last 72 hours. No results for input(s): WBC, RBC, HCT, PLT in the last 72 hours. No results for input(s): NA, K, CL, CO2, BUN, CREATININE, GLUCOSE, CALCIUM in the last 72 hours. No results for input(s): LABPT, INR in the last 72 hours.   EXAM General - Patient is Alert, Appropriate and Oriented Extremity - Neurovascular intact Dorsiflexion/Plantar flexion intact Compartment soft Dressing/Incision -Postoperative dressing remains in place., Polar Care in place and working. , Hemovac in place.  Motor Function - intact, moving foot and toes well on exam. Able to perform independent SLR.  Cardiovascular- Regular rate and rhythm, no murmurs/rubs/gallops Respiratory- Lungs clear to auscultation bilaterally Gastrointestinal- soft, nontender and active bowel sounds   Assessment/Plan: 1 Day Post-Op Procedure(s) (LRB): COMPUTER ASSISTED TOTAL KNEE ARTHROPLASTY (Left) Active Problems:   Total knee replacement status  Estimated body mass  index is 30.18 kg/m as calculated from the following:   Height as of 08/23/20: 5\' 2"  (1.575 m).   Weight as of 08/23/20: 74.8 kg. Advance diet Up with therapy  Possible d/c this evening pending completion of PT goals.      DVT Prophylaxis - Lovenox, Ted hose and foot pumps Weight-Bearing as tolerated to left leg  Cassell Smiles, PA-C [facility] 08/31/2020, 8:04 AM

## 2020-08-31 NOTE — Progress Notes (Signed)
Met with the patient in the room to discuss DC plan and needs She lives at home with her spouse Has transportation Can afford her meds Has a rw and a 3 in 1  Is set up with [facility]

## 2020-08-31 NOTE — Plan of Care (Signed)
Post-op dressing removed. , Hemovac removed. and Mini compression dressing applied.

## 2020-08-31 NOTE — Progress Notes (Signed)
   08/31/20 1551  Advance Directives (For Healthcare)  Does Patient Have a Medical Advance Directive? Yes  Does patient want to make changes to medical advance directive? No - Patient declined  Type of Advance Directive [place] in Chart? Yes - validated most recent copy scanned in chart (See row information)  [place]  Does Patient Have a Mental Health Advance Directive? No  Chaplain Tyger Oka completed an AD for Mrs. Casimiro Needle. Her Health Care Agent is Mr. Phillis Haggis, [place]) [phone].

## 2020-10-18 ENCOUNTER — Other Ambulatory Visit: Payer: Self-pay | Admitting: Orthopedic Surgery

## 2020-10-18 ENCOUNTER — Other Ambulatory Visit (HOSPITAL_COMMUNITY): Payer: Self-pay | Admitting: Orthopedic Surgery

## 2020-10-18 DIAGNOSIS — M75121 Complete rotator cuff tear or rupture of right shoulder, not specified as traumatic: Secondary | ICD-10-CM

## 2020-11-02 ENCOUNTER — Other Ambulatory Visit: Payer: Self-pay

## 2020-11-02 ENCOUNTER — Ambulatory Visit
Admission: RE | Admit: 2020-11-02 | Discharge: 2020-11-02 | Disposition: A | Discharge status: Home / Self Care | Payer: Medicare Other | Source: Ambulatory Visit | Attending: Orthopedic Surgery | Admitting: Orthopedic Surgery

## 2020-11-02 DIAGNOSIS — M75121 Complete rotator cuff tear or rupture of right shoulder, not specified as traumatic: Secondary | ICD-10-CM | POA: Insufficient documentation

## 2020-12-07 ENCOUNTER — Other Ambulatory Visit: Payer: Self-pay | Admitting: Otolaryngology

## 2020-12-07 DIAGNOSIS — J3801 Paralysis of vocal cords and larynx, unilateral: Secondary | ICD-10-CM

## 2021-01-02 ENCOUNTER — Ambulatory Visit
Admission: RE | Admit: 2021-01-02 | Discharge: 2021-01-02 | Disposition: A | Discharge status: Home / Self Care | Payer: Medicare Other | Source: Ambulatory Visit | Attending: Otolaryngology | Admitting: Otolaryngology

## 2021-01-02 ENCOUNTER — Other Ambulatory Visit: Payer: Self-pay

## 2021-01-02 DIAGNOSIS — J3801 Paralysis of vocal cords and larynx, unilateral: Secondary | ICD-10-CM | POA: Diagnosis not present

## 2021-01-02 LAB — POCT I-STAT CREATININE: Creatinine, Ser: 0.6 mg/dL (ref 0.44–1.00)

## 2021-01-02 MED ORDER — IOHEXOL 350 MG/ML SOLN
60.0000 mL | Freq: Once | INTRAVENOUS | Status: AC | PRN
  Administered 2021-01-02: 60 mL via INTRAVENOUS

## 2021-01-08 ENCOUNTER — Other Ambulatory Visit: Payer: Self-pay | Admitting: Obstetrics and Gynecology

## 2021-01-08 DIAGNOSIS — N6459 Other signs and symptoms in breast: Secondary | ICD-10-CM

## 2021-01-25 ENCOUNTER — Other Ambulatory Visit: Payer: Medicare Other

## 2021-01-31 ENCOUNTER — Ambulatory Visit
Admission: RE | Admit: 2021-01-31 | Discharge: 2021-01-31 | Disposition: A | Discharge status: Home / Self Care | Payer: Medicare Other | Source: Ambulatory Visit | Attending: Obstetrics and Gynecology | Admitting: Obstetrics and Gynecology

## 2021-01-31 ENCOUNTER — Other Ambulatory Visit: Payer: Self-pay

## 2021-01-31 DIAGNOSIS — N6459 Other signs and symptoms in breast: Secondary | ICD-10-CM

## 2021-03-15 ENCOUNTER — Other Ambulatory Visit: Payer: Self-pay

## 2021-03-15 ENCOUNTER — Ambulatory Visit: Payer: Medicare Other | Attending: Internal Medicine | Admitting: Speech Pathology

## 2021-03-15 DIAGNOSIS — J3801 Paralysis of vocal cords and larynx, unilateral: Secondary | ICD-10-CM | POA: Diagnosis present

## 2021-03-15 DIAGNOSIS — R490 Dysphonia: Secondary | ICD-10-CM | POA: Insufficient documentation

## 2021-03-15 NOTE — Patient Instructions (Signed)
Reduce coughing by taking a sip of water and swallowing Reduce throat clearing by taking deep breath thru nose

## 2021-03-15 NOTE — Therapy (Signed)
[place] MAIN [facility] SERVICES [address] [place], [place], [address] Phone: [phone]   Fax:  [phone]  Speech Language Pathology Evaluation  Patient Details  Name: SPRING SAN MRN: [id number] Date of Birth: 1956-04-07 Referring Provider (SLP): Carloyn Manner   Encounter Date: 03/15/2021   End of Session - 03/15/21 1740     Visit Number 1    Number of Visits 17    Date for SLP Re-Evaluation 05/10/21    Authorization Type Medicare    Authorization Time Period 03/15/2021 thru 05/10/2021    Authorization - Visit Number 1    Progress Note Due on Visit 10    SLP Start Time 1110    SLP Stop Time  1200    SLP Time Calculation (min) 50 min    Activity Tolerance Patient tolerated treatment well             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    Family history of adverse reaction to anesthesia    Mother - PONV   GERD (gastroesophageal reflux disease)    Headache    migraine - couple times per year   Heart palpitations    History of kidney stones    Hyperlipemia    Insomnia    Osteopenia    PONV (postoperative nausea and vomiting)    scopolamine has worked well in the past   Vaginal atrophy     Past Surgical History:  Procedure Laterality Date   ADENOIDECTOMY     APPENDECTOMY     ARTHROSCOPIC REPAIR ACL     BROW LIFT Bilateral 07/22/2017   Procedure: BLEPHAROPLASTY UPPER EYELID WITH EXCESS SKIN;  Surgeon: Karle Starch, MD;  Location: [place];  Service: Ophthalmology;  Laterality: Bilateral;   COLONOSCOPY WITH PROPOFOL N/A 09/17/2016   Procedure: COLONOSCOPY WITH PROPOFOL;  Surgeon: Lucilla Lame, MD;  Location: [facility] ENDOSCOPY;  Service: Endoscopy;  Laterality: N/A;   FOOT SURGERY     KNEE ARTHROPLASTY Left 08/30/2020   Procedure: COMPUTER ASSISTED TOTAL KNEE ARTHROPLASTY;  Surgeon: Dereck Leep, MD;  Location: [facility];  Service:  Orthopedics;  Laterality: Left;   REDUCTION MAMMAPLASTY Bilateral 03/2018   ROTATOR CUFF REPAIR Left    SHOULDER ARTHROSCOPY WITH SUBACROMIAL DECOMPRESSION AND OPEN ROTATOR C Right 12/06/2019   Procedure: Right arthroscopic rotator cuff repair  Right arthroscopic extensive debridement of shoulder (glenohumeral and subacromial spaces)  Right arthroscopic subacromial decompression;  Surgeon: Leim Fabry, MD;  Location: [facility];  Service: Orthopedics;  Laterality: Right;   TONSILLECTOMY      There were no vitals filed for this visit.   Subjective Assessment - 03/15/21 1726     Subjective "Sometimes my voice is so hoarse that people can't hear just a couple feet away"    Currently in Pain? 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 CT soft tissue neck on 12/2020  was negative for any abnormalities. Most recent laryngoscopy revealed paresis of left TVF, asymmetry from right but appears intermittent decreased adduction compared to right and intermittent tremore. Unclear if this is from previous nerve injury or possibly from larger neurological issue. Appears more subtle today than before so this could also be healing given normal CT.    Behavioral/Cognition talkative    Mobility Status ambulatory      Balance Screen   Has the patient fallen in the past 6 months No    Has the patient had a decrease in activity level because of a fear of falling?  No    Is the patient reluctant to leave their home because of a fear of falling?  No      Cognition   Overall Cognitive Status Within Functional Limits for tasks assessed      Oral Motor/Sensory Function    Overall Oral Motor/Sensory Function Appears within functional limits for tasks assessed      Motor Speech   Overall Motor Speech Impaired    Respiration Within functional limits    Phonation Hoarse;Breathy    Resonance Within functional limits    Articulation Within functional limitis    Intelligibility Intelligible    Motor Planning Witnin functional limits    Motor Speech Errors Not applicable    Phonation Impaired    Vocal Abuses Vocal Fold Dehydration;Prolonged Vocal Use;Habitual Cough/Throat Clear    Volume Appropriate    Pitch Low      Standardized Assessments   Standardized Assessments  --   Perceputal Voice Evaluation and Voice Handicap Index                            SLP Education - 03/15/21 1739     Education Details results of assessment, ST POC, phonotraumatic behaviors, reflux    Person(s) Educated Patient    Methods Explanation;Demonstration;Handout    Comprehension Verbalized understanding;Need further instruction              SLP Short Term Goals - 03/15/21 2011       SLP SHORT TERM GOAL #1   Title Patient will eliminate phonotraumatic behaviors such as chronic throat clearing, by substituting non-traumatic methods.    Baseline severe habitual throat clearing and coughing    Time 10    Period --   sessions   Status New      SLP SHORT TERM GOAL #2   Title With supervision cues, patient will decrease laryngeal and articulatory muscle tension by independently completing relaxation/stretching exercises.    Baseline new goal    Time 10    Period --   sessions   Status New      SLP SHORT TERM GOAL #3   Title With moderate cues, patient will demonstrate abdominal breathing patterns and steady release of breath on exhalation to optimize efficiency of voicing and decrease laryngeal hyperfunction.    Baseline new goal    Time 10    Period --   sessions   Status New      SLP SHORT TERM GOAL #4   Title The patient will produce easy  onset words (e.g. /h/ initial) with appropriate voicing in 80% of opportunities given frequent maximal verbal cues.    Baseline new goals    Time 10    Period --   sessions   Status New              SLP Long  Term Goals - 03/15/21 2017       SLP LONG TERM GOAL #1   Title The client will improve voice quality in order to functionally communicate with adults and peers.    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 She describes her dysphonia as intermittent, no trigger observed, not increased in frequency since first noticing issue ~ 1 year ago and sudden hoarseness to results in < 25% speech intelligibility. During the course of this evaluation, pt didn't have any episodes. She does present with severe habitual coughing and throat clearing, reduced pitch, and vocal quality that is slightly tremorous at times. See Perceptual Voice Evaluation Below.  I recommend skilled ST to train pt in vocal hygiene, improve breath support for voice and reduce laryngeal tension in order to improve vocal quality, endurance, and quality of life.     Perceptual Voice Evaluation    Voice Case History     Health risks:    high ; caffeine use approx. 16oz daily, daily H20 intake average  8 oz; reflux    Characteristic voice use: excessive   Environmental risks: husband is HOH, pt uses loud voice to speak with him    Misuse: excessive talking    Phonotraumatic behaviors: severe (frequent throat clearing, frequent coughing, excessive voice use)   Vocal characteristics: self-reports episodes as hoarse, gravelly, rough,reduced ability to project  Objective Voice Measurements   Maximum  phonation time for sustained "ah": 29 seconds   Average fundamental frequency during sustained "ah": 193 Hz   (2 SD below average of  244 Hz +244 for gender)   Habitual pitch: 174 Hz  Highest dynamic pitch when altering pitch from a low note to a high note: 259 Hz  Lowest dynamic pitch when altering from a high note to a low note: 209 Hz     Highest dynamic pitch in conversational speech: 205 Hz   Lowest dynamic pitch in conversational speech: 174 Hz   Average time patient was able to sustain /s/:  13.53 seconds   Average time patient was able to sustain /z/: 17.79 seconds   s/z ratio:  (suggestive of dysfunction > 1.0) .76   VHI Score:  The Voice Handicap Index is comprised of a series of questions to assess the patient's perception of their voice. It is designed to evaluate the emotional, physical and functional components of the voice problem.    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    Problem  List Patient Active Problem List   Diagnosis Date Noted   Total knee replacement status 08/30/2020   Chronic insomnia 11/22/2019   History of pneumonia 11/22/2019   Pelvic pain in female 11/22/2019   Acquired hypothyroidism 11/09/2019   Other and unspecified hyperlipidemia 06/20/2019   Abnormal glucose 08/26/2018   Benign neoplasm of ascending colon 08/28/2017   Obesity (BMI 30.0-34.9) 10/22/2016   Trigger middle finger of right hand 10/22/2016   History of colon polyps     Benign neoplasm of cecum    Polyp of sigmoid colon    Benign neoplasm of transverse colon    GERD (gastroesophageal reflux disease) 08/02/2016   Osteoarthritis of multiple joints 08/02/2016   Osteopenia 08/02/2016   Pure hypercholesterolemia 08/02/2016   Primary osteoarthritis of left knee 07/16/2016   Lumbar radiculopathy 01/05/2015   Bursitis of hip 11/03/2014   Pain in right hip 11/03/2014   Complete rotator cuff rupture of left shoulder 04/05/2014   Chronic cervical pain 03/01/2014   Pain in shoulder 03/01/2014   Impingement syndrome of left shoulder 02/22/2014   Impingement syndrome of shoulder 02/22/2014   Damontay Alred B. Rutherford Nail M.S., CCC-SLP, [facility] Speech-Language Pathologist Rehabilitation Services Office [phone]  Stormy Fabian, [place] 03/15/2021, 8:19 PM  [place] MAIN [facility] SERVICES [address] [place], [place], [address] Phone: [phone]   Fax:  [phone]  Name: CORTNE AMARA MRN: [id number] Date of Birth: 29-Jun-1955

## 2021-03-20 ENCOUNTER — Ambulatory Visit: Payer: Medicare Other | Admitting: Speech Pathology

## 2021-03-21 ENCOUNTER — Other Ambulatory Visit: Payer: Self-pay

## 2021-03-21 ENCOUNTER — Ambulatory Visit: Payer: Medicare Other | Admitting: Speech Pathology

## 2021-03-21 DIAGNOSIS — R490 Dysphonia: Secondary | ICD-10-CM | POA: Diagnosis not present

## 2021-03-21 DIAGNOSIS — J3801 Paralysis of vocal cords and larynx, unilateral: Secondary | ICD-10-CM

## 2021-03-22 NOTE — Patient Instructions (Signed)
Implement vocal hygiene recommendations

## 2021-03-22 NOTE — Therapy (Signed)
[place] MAIN [facility] SERVICES [address] [place], [place], [address] Phone: [phone]   Fax:  [phone]  Speech Language Pathology Treatment  Patient Details  Name: Cindy Carpenter MRN: [id number] Date of Birth: [birth year] Referring Provider (SLP): Carloyn Manner   Encounter Date: 03/21/2021   End of Session - 03/22/21 1651     Visit Number 2    Number of Visits 17    Date for SLP Re-Evaluation 05/10/21    Authorization Type Medicare    Authorization Time Period 03/15/2021 thru 05/10/2021    Authorization - Visit Number 2    Progress Note Due on Visit 10    SLP Start Time 1400    SLP Stop Time  1500    SLP Time Calculation (min) 60 min    Activity Tolerance Patient tolerated treatment well             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    Family history of adverse reaction to anesthesia    Mother - PONV   GERD (gastroesophageal reflux disease)    Headache    migraine - couple times per year   Heart palpitations    History of kidney stones    Hyperlipemia    Insomnia    Osteopenia    PONV (postoperative nausea and vomiting)    scopolamine has worked well in the past   Vaginal atrophy     Past Surgical History:  Procedure Laterality Date   ADENOIDECTOMY     APPENDECTOMY     ARTHROSCOPIC REPAIR ACL     BROW LIFT Bilateral 07/22/2017   Procedure: BLEPHAROPLASTY UPPER EYELID WITH EXCESS SKIN;  Surgeon: Karle Starch, MD;  Location: [place];  Service: Ophthalmology;  Laterality: Bilateral;   COLONOSCOPY WITH PROPOFOL N/A 09/17/2016   Procedure: COLONOSCOPY WITH PROPOFOL;  Surgeon: Lucilla Lame, MD;  Location: [facility] ENDOSCOPY;  Service: Endoscopy;  Laterality: N/A;   FOOT SURGERY     KNEE ARTHROPLASTY Left 08/30/2020   Procedure: COMPUTER ASSISTED TOTAL KNEE ARTHROPLASTY;  Surgeon: Dereck Leep, MD;  Location: [facility];  Service:  Orthopedics;  Laterality: Left;   REDUCTION MAMMAPLASTY Bilateral 03/2018   ROTATOR CUFF REPAIR Left    SHOULDER ARTHROSCOPY WITH SUBACROMIAL DECOMPRESSION AND OPEN ROTATOR C Right 12/06/2019   Procedure: Right arthroscopic rotator cuff repair  Right arthroscopic extensive debridement of shoulder (glenohumeral and subacromial spaces)  Right arthroscopic subacromial decompression;  Surgeon: Leim Fabry, MD;  Location: [facility];  Service: Orthopedics;  Laterality: Right;   TONSILLECTOMY      There were no vitals filed for this visit.   Subjective Assessment - 03/22/21 1650     Subjective "We didn't go out of town"    Currently in Pain? No/denies                   ADULT SLP TREATMENT - 03/22/21 0001       Treatment Provided   Treatment provided Cognitive-Linquistic      Cognitive-Linquistic Treatment   Treatment focused on Voice;Patient/family/caregiver education    Skilled Treatment Skilled treatment session focused on vocal hygiene. Reports consistent use of omeprazole with greatly reduced coughing and symptoms in the morning; Independent self-monitoring of throat clearing with independent use of alternative strategies; SLP provided formal instruction with handouts on Throat Clearing and Lifestyle, Hydration and Managing Mucus, Do's and  Don'ts for Maximizing Your Voice; no vocal dysfunction observed during today's session; pt instructed in setting reminder alarms to perform stretches when sitting at the computer              SLP Education - 03/22/21 1651     Education Details provided; see treatment for full details    Person(s) Educated Patient    Methods Explanation;Demonstration;Verbal cues;Handout    Comprehension Verbalized understanding;Returned demonstration              SLP Short Term Goals - 03/15/21 2011       SLP SHORT TERM GOAL #1   Title Patient will eliminate phonotraumatic behaviors such as chronic throat clearing, by substituting non-traumatic  methods.    Baseline severe habitual throat clearing and coughing    Time 10    Period --   sessions   Status New      SLP SHORT TERM GOAL #2   Title With supervision cues, patient will decrease laryngeal and articulatory muscle tension by independently completing relaxation/stretching exercises.    Baseline new goal    Time 10    Period --   sessions   Status New      SLP SHORT TERM GOAL #3   Title With moderate cues, patient will demonstrate abdominal breathing patterns and steady release of breath on exhalation to optimize efficiency of voicing and decrease laryngeal hyperfunction.    Baseline new goal    Time 10    Period --   sessions   Status New      SLP SHORT TERM GOAL #4   Title The patient will produce easy onset words (e.g. /h/ initial) with appropriate voicing in 80% of opportunities given frequent maximal verbal cues.    Baseline new goals    Time 10    Period --   sessions   Status New              SLP Long Term Goals - 03/15/21 2017       SLP LONG TERM GOAL #1   Title The client will improve voice quality in order to functionally communicate with adults and peers.    Baseline new goal    Time 8    Period Weeks    Status New    Target Date 05/10/21              Plan - 03/22/21 1653     Clinical Impression Statement Pt presents with improving vocal hygiene, consist use of reflux precautions as well as improved vocal quality. As a result, anticipate one additional session to finish all education and answer any pt questions.    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    Problem List Patient Active Problem List   Diagnosis Date Noted   Total knee replacement status 08/30/2020   Chronic insomnia 11/22/2019   History of pneumonia 11/22/2019   Pelvic pain in female 11/22/2019   Acquired hypothyroidism 11/09/2019   Other and unspecified hyperlipidemia  06/20/2019   Abnormal glucose 08/26/2018   Benign neoplasm of ascending colon 08/28/2017   Obesity (BMI 30.0-34.9) 10/22/2016   Trigger middle finger of right hand 10/22/2016   History of colon polyps    Benign neoplasm of cecum    Polyp of sigmoid colon    Benign neoplasm of transverse colon    GERD (gastroesophageal reflux disease) 08/02/2016   Osteoarthritis of multiple joints 08/02/2016   Osteopenia 08/02/2016   Pure hypercholesterolemia 08/02/2016   Primary osteoarthritis of left knee 07/16/2016   Lumbar radiculopathy 01/05/2015   Bursitis of hip 11/03/2014   Pain in right hip 11/03/2014   Complete rotator cuff rupture of left shoulder 04/05/2014   Chronic cervical pain 03/01/2014   Pain in shoulder 03/01/2014   Impingement syndrome of left shoulder 02/22/2014   Impingement syndrome of shoulder 02/22/2014   Asna Muldrow B. Rutherford Nail M.S., CCC-SLP, [facility] Speech-Language Pathologist Rehabilitation Services Office [phone]   Stormy Fabian, [place] 03/22/2021, 4:54 PM  [place] MAIN [facility] SERVICES [address] [place], [place], [address] Phone: [phone]   Fax:  [phone]   Name: Cindy Carpenter MRN: [id number] Date of Birth: 20-Mar-1956

## 2021-03-23 ENCOUNTER — Other Ambulatory Visit: Payer: Self-pay

## 2021-03-23 ENCOUNTER — Ambulatory Visit: Payer: Medicare Other | Admitting: Speech Pathology

## 2021-03-23 DIAGNOSIS — R490 Dysphonia: Secondary | ICD-10-CM

## 2021-03-23 DIAGNOSIS — J3801 Paralysis of vocal cords and larynx, unilateral: Secondary | ICD-10-CM

## 2021-03-23 NOTE — Therapy (Signed)
[place] MAIN [facility] SERVICES [address] [place], [place], [address] Phone: [phone]   Fax:  [phone]  Speech Language Pathology Treatment  Patient Details  Name: Cindy Carpenter MRN: [id number] Date of Birth: Mar 13, 1956 Referring Provider (SLP): Carloyn Manner   Encounter Date: 03/23/2021   End of Session - 03/23/21 1253     Visit Number 3    Number of Visits 17    Date for SLP Re-Evaluation 05/10/21    Authorization Type Medicare    Authorization Time Period 03/15/2021 thru 05/10/2021    Authorization - Visit Number 3    Progress Note Due on Visit 10    SLP Start Time 1005    SLP Stop Time  1100    SLP Time Calculation (min) 55 min    Activity Tolerance Patient tolerated treatment well             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    Family history of adverse reaction to anesthesia    Mother - PONV   GERD (gastroesophageal reflux disease)    Headache    migraine - couple times per year   Heart palpitations    History of kidney stones    Hyperlipemia    Insomnia    Osteopenia    PONV (postoperative nausea and vomiting)    scopolamine has worked well in the past   Vaginal atrophy     Past Surgical History:  Procedure Laterality Date   ADENOIDECTOMY     APPENDECTOMY     ARTHROSCOPIC REPAIR ACL     BROW LIFT Bilateral 07/22/2017   Procedure: BLEPHAROPLASTY UPPER EYELID WITH EXCESS SKIN;  Surgeon: Karle Starch, MD;  Location: [facility];  Service: Ophthalmology;  Laterality: Bilateral;   COLONOSCOPY WITH PROPOFOL N/A 09/17/2016   Procedure: COLONOSCOPY WITH PROPOFOL;  Surgeon: Lucilla Lame, MD;  Location: [facility] ENDOSCOPY;  Service: Endoscopy;  Laterality: N/A;   FOOT SURGERY     KNEE ARTHROPLASTY Left 08/30/2020   Procedure: COMPUTER ASSISTED TOTAL KNEE ARTHROPLASTY;  Surgeon: Dereck Leep, MD;  Location: [facility]S;  Service:  Orthopedics;  Laterality: Left;   REDUCTION MAMMAPLASTY Bilateral 03/2018   ROTATOR CUFF REPAIR Left    SHOULDER ARTHROSCOPY WITH SUBACROMIAL DECOMPRESSION AND OPEN ROTATOR C Right 12/06/2019   Procedure: Right arthroscopic rotator cuff repair  Right arthroscopic extensive debridement of shoulder (glenohumeral and subacromial spaces)  Right arthroscopic subacromial decompression;  Surgeon: Leim Fabry, MD;  Location: [facility] ORS;  Service: Orthopedics;  Laterality: Right;   TONSILLECTOMY      There were no vitals filed for this visit.   Subjective Assessment - 03/23/21 1252     Subjective "Sorry I am a minute late, valet parking was busy"    Currently in Pain? No/denies                   ADULT SLP TREATMENT - 03/23/21 0001       Treatment Provided   Treatment provided Cognitive-Linquistic      Cognitive-Linquistic Treatment   Treatment focused on Voice;Patient/family/caregiver education    Skilled Treatment Skilled treatment session focused on vocal quality during extended conversation. Pt was Mod I with using strategies to produce and sustain good quality voicing at increased loudness level to achieve > 95% speech intelligibility. She was also able to suppress cough reflex despite urge.  SLP Education - 03/23/21 1253     Education Details Vocal rest when visitng family over the weekend    Person(s) Educated Patient    Methods Explanation    Comprehension Verbalized understanding;Returned demonstration              SLP Short Term Goals - 03/15/21 2011       SLP SHORT TERM GOAL #1   Title Patient will eliminate phonotraumatic behaviors such as chronic throat clearing, by substituting non-traumatic methods.    Baseline severe habitual throat clearing and coughing    Time 10    Period --   sessions   Status New      SLP SHORT TERM GOAL #2   Title With supervision cues, patient will decrease laryngeal and articulatory muscle tension by  independently completing relaxation/stretching exercises.    Baseline new goal    Time 10    Period --   sessions   Status New      SLP SHORT TERM GOAL #3   Title With moderate cues, patient will demonstrate abdominal breathing patterns and steady release of breath on exhalation to optimize efficiency of voicing and decrease laryngeal hyperfunction.    Baseline new goal    Time 10    Period --   sessions   Status New      SLP SHORT TERM GOAL #4   Title The patient will produce easy onset words (e.g. /h/ initial) with appropriate voicing in 80% of opportunities given frequent maximal verbal cues.    Baseline new goals    Time 10    Period --   sessions   Status New              SLP Long Term Goals - 03/15/21 2017       SLP LONG TERM GOAL #1   Title The client will improve voice quality in order to functionally communicate with adults and peers.    Baseline new goal    Time 8    Period Weeks    Status New    Target Date 05/10/21              Plan - 03/23/21 1254     Clinical Impression Statement Pt presents with improving vocal hygiene, consist use of reflux precautions as well as improved vocal quality. As a result, anticipate one additional session to finish all education and answer any pt questions.    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    Problem List Patient Active Problem List   Diagnosis Date Noted   Total knee replacement status 08/30/2020   Chronic insomnia 11/22/2019   History of pneumonia 11/22/2019    Pelvic pain in female 11/22/2019   Acquired hypothyroidism 11/09/2019   Other and unspecified hyperlipidemia 06/20/2019   Abnormal glucose 08/26/2018   Benign neoplasm of ascending colon 08/28/2017   Obesity (BMI 30.0-34.9) 10/22/2016   Trigger middle finger of right hand 10/22/2016   History of colon polyps  Benign neoplasm of cecum    Polyp of sigmoid colon    Benign neoplasm of transverse colon    GERD (gastroesophageal reflux disease) 08/02/2016   Osteoarthritis of multiple joints 08/02/2016   Osteopenia 08/02/2016   Pure hypercholesterolemia 08/02/2016   Primary osteoarthritis of left knee 07/16/2016   Lumbar radiculopathy 01/05/2015   Bursitis of hip 11/03/2014   Pain in right hip 11/03/2014   Complete rotator cuff rupture of left shoulder 04/05/2014   Chronic cervical pain 03/01/2014   Pain in shoulder 03/01/2014   Impingement syndrome of left shoulder 02/22/2014   Impingement syndrome of shoulder 02/22/2014   Sonnie Bias B. Rutherford Nail M.S., CCC-SLP, [facility] Pathologist Rehabilitation Services Office [place], [place] 03/23/2021, 12:54 PM  [place] MAIN [facility] SERVICES [address] [place], [place], [address] Phone: [phone]   Fax:  [phone]   Name: Cindy Carpenter MRN: [id number] Date of Birth: [birth year]

## 2021-03-27 ENCOUNTER — Encounter: Payer: Medicare Other | Admitting: Speech Pathology

## 2021-03-29 ENCOUNTER — Ambulatory Visit: Payer: Medicare Other | Admitting: Speech Pathology

## 2021-04-03 ENCOUNTER — Encounter: Payer: Medicare Other | Admitting: Speech Pathology

## 2021-04-05 ENCOUNTER — Encounter: Payer: Medicare Other | Admitting: Speech Pathology

## 2021-05-17 ENCOUNTER — Other Ambulatory Visit: Payer: Self-pay | Admitting: Neurology

## 2021-05-17 DIAGNOSIS — R519 Headache, unspecified: Secondary | ICD-10-CM

## 2021-05-28 ENCOUNTER — Ambulatory Visit: Payer: Medicare Other

## 2021-05-29 ENCOUNTER — Ambulatory Visit: Admission: RE | Admit: 2021-05-29 | Payer: Medicare Other | Source: Ambulatory Visit

## 2021-06-07 ENCOUNTER — Other Ambulatory Visit: Payer: Self-pay

## 2021-06-07 ENCOUNTER — Ambulatory Visit
Admission: RE | Admit: 2021-06-07 | Discharge: 2021-06-07 | Disposition: A | Discharge status: Home / Self Care | Payer: Medicare Other | Source: Ambulatory Visit | Attending: Neurology | Admitting: Neurology

## 2021-06-07 DIAGNOSIS — R519 Headache, unspecified: Secondary | ICD-10-CM | POA: Diagnosis present

## 2021-06-07 MED ORDER — GADOBUTROL 1 MMOL/ML IV SOLN
7.5000 mL | Freq: Once | INTRAVENOUS | Status: AC | PRN
  Administered 2021-06-07: 7.5 mL via INTRAVENOUS

## 2021-09-12 ENCOUNTER — Other Ambulatory Visit: Payer: Self-pay | Admitting: Obstetrics and Gynecology

## 2021-09-12 DIAGNOSIS — N6310 Unspecified lump in the right breast, unspecified quadrant: Secondary | ICD-10-CM

## 2021-09-12 DIAGNOSIS — N63 Unspecified lump in unspecified breast: Secondary | ICD-10-CM

## 2021-09-28 ENCOUNTER — Ambulatory Visit
Admission: RE | Admit: 2021-09-28 | Discharge: 2021-09-28 | Disposition: A | Discharge status: Home / Self Care | Payer: Medicare Other | Source: Ambulatory Visit | Attending: Obstetrics and Gynecology | Admitting: Obstetrics and Gynecology

## 2021-09-28 DIAGNOSIS — N641 Fat necrosis of breast: Secondary | ICD-10-CM | POA: Insufficient documentation

## 2021-09-28 DIAGNOSIS — N6489 Other specified disorders of breast: Secondary | ICD-10-CM | POA: Diagnosis not present

## 2021-09-28 DIAGNOSIS — Z9889 Other specified postprocedural states: Secondary | ICD-10-CM | POA: Diagnosis not present

## 2021-09-28 DIAGNOSIS — N63 Unspecified lump in unspecified breast: Secondary | ICD-10-CM

## 2021-09-28 DIAGNOSIS — Z1239 Encounter for other screening for malignant neoplasm of breast: Secondary | ICD-10-CM | POA: Insufficient documentation

## 2021-09-28 DIAGNOSIS — N6315 Unspecified lump in the right breast, overlapping quadrants: Secondary | ICD-10-CM | POA: Diagnosis not present

## 2022-02-13 ENCOUNTER — Other Ambulatory Visit: Payer: Self-pay | Admitting: Obstetrics and Gynecology

## 2022-02-13 DIAGNOSIS — N641 Fat necrosis of breast: Secondary | ICD-10-CM

## 2022-02-14 ENCOUNTER — Other Ambulatory Visit: Payer: Self-pay | Admitting: Obstetrics and Gynecology

## 2022-02-14 DIAGNOSIS — N6489 Other specified disorders of breast: Secondary | ICD-10-CM

## 2022-04-03 ENCOUNTER — Other Ambulatory Visit: Payer: Medicare Other

## 2022-04-10 ENCOUNTER — Ambulatory Visit
Admission: RE | Admit: 2022-04-10 | Discharge: 2022-04-10 | Disposition: A | Discharge status: Home / Self Care | Payer: Medicare Other | Source: Ambulatory Visit | Attending: Obstetrics and Gynecology | Admitting: Obstetrics and Gynecology

## 2022-04-10 DIAGNOSIS — N641 Fat necrosis of breast: Secondary | ICD-10-CM | POA: Insufficient documentation

## 2022-04-10 DIAGNOSIS — N6489 Other specified disorders of breast: Secondary | ICD-10-CM | POA: Insufficient documentation

## 2022-04-19 ENCOUNTER — Telehealth: Payer: Self-pay | Admitting: Gastroenterology

## 2022-04-19 NOTE — Telephone Encounter (Signed)
Returned patients call. LVM for pt to return my call.   Thanks,  [place], [place]

## 2022-04-19 NOTE — Telephone Encounter (Signed)
Patient calling to schedule repeat colonoscopy. Requesting call back.  

## 2022-04-24 NOTE — Telephone Encounter (Signed)
Voicemail message left for patient to return my call.  

## 2022-04-26 ENCOUNTER — Other Ambulatory Visit: Payer: Self-pay | Admitting: *Deleted

## 2022-04-26 ENCOUNTER — Telehealth: Payer: Self-pay | Admitting: Gastroenterology

## 2022-04-26 DIAGNOSIS — Z86010 Personal history of colonic polyps: Secondary | ICD-10-CM

## 2022-04-26 MED ORDER — CLENPIQ 10-3.5-12 MG-GM -GM/175ML PO SOLN: 1.0000 | Freq: Once | ORAL | 0 refills | Status: AC

## 2022-04-26 NOTE — Telephone Encounter (Signed)
Patient calling to schedule repeat colonoscopy. Will not have access to her phone after noon.

## 2022-04-26 NOTE — Telephone Encounter (Signed)
Gastroenterology Pre-Procedure Review  Request Date: 07/22/2022 Requesting Physician: Dr. Allen Norris  PATIENT REVIEW QUESTIONS: The patient responded to the following health history questions as indicated:    1. Are you having any GI issues? no 2. Do you have a personal history of Polyps? yes (09/17/2016) 3. Do you have a family history of Colon Cancer or Polyps? yes (brother) 4. Diabetes Mellitus? no 5. Joint replacements in the past 12 months?no 6. Major health problems in the past 3 months?no 7. Any artificial heart valves, MVP, or defibrillator?no    MEDICATIONS & ALLERGIES:    Patient reports the following regarding taking any anticoagulation/antiplatelet therapy:   Plavix, Coumadin, Eliquis, Xarelto, Lovenox, Pradaxa, Brilinta, or Effient? no Aspirin? no  Patient confirms/reports the following medications:  Current Outpatient Medications  Medication Sig Dispense Refill   buPROPion (WELLBUTRIN XL) 300 MG 24 hr tablet Take 300 mg by mouth daily.      butalbital-aspirin-caffeine (FIORINAL) 50-325-40 MG capsule Take 1 capsule by mouth every 4 (four) hours as needed (migraines.).     Calcium Carb-Cholecalciferol (CALCIUM + D3 PO) Take 1 tablet by mouth in the morning and at bedtime.     celecoxib (CELEBREX) 200 MG capsule Take 1 capsule (200 mg total) by mouth 2 (two) times daily. 90 capsule 0   DULoxetine (CYMBALTA) 30 MG capsule Take 30 mg by mouth daily.     enoxaparin (LOVENOX) 40 MG/0.4ML injection Inject 0.4 mLs (40 mg total) into the skin daily for 14 days. 5.6 mL 0   fluticasone (FLONASE) 50 MCG/ACT nasal spray Place 1-2 sprays into both nostrils daily as needed (allergies.).      levothyroxine (SYNTHROID, LEVOTHROID) 50 MCG tablet Take 50 mcg by mouth daily before breakfast.     omeprazole (PRILOSEC) 10 MG capsule Take 10 mg by mouth daily.     oxyCODONE (OXY IR/ROXICODONE) 5 MG immediate release tablet Take 1 tablet (5 mg total) by mouth every 4 (four) hours as needed for moderate  pain (pain score 4-6). 30 tablet 0   raloxifene (EVISTA) 60 MG tablet Take 60 mg by mouth daily.      simvastatin (ZOCOR) 20 MG tablet Take 20 mg by mouth at bedtime.      solifenacin (VESICARE) 10 MG tablet Take 10 mg by mouth daily.  (Patient not taking: No sig reported)     traZODone (DESYREL) 50 MG tablet Take 50-100 mg by mouth at bedtime.     No current facility-administered medications for this visit.    Patient confirms/reports the following allergies:  Allergies  Allergen Reactions   Morphine Nausea Only   Other Anaphylaxis    IVP contrast dye   Penicillin G Anaphylaxis   Iodinated Contrast Media Swelling   Codeine Nausea Only   Shellfish Allergy Swelling    No orders of the defined types were placed in this encounter.   AUTHORIZATION INFORMATION Primary Insurance: 1D#: Group #:  Secondary Insurance: 1D#: Group #:  SCHEDULE INFORMATION: Date: 07/22/2022 Time: Location: [facility]

## 2022-04-26 NOTE — Addendum Note (Signed)
Addended by: Jacqualin Combes on: 04/26/2022 10:58 AM   Modules accepted: Orders

## 2022-05-20 ENCOUNTER — Telehealth: Payer: Self-pay | Admitting: *Deleted

## 2022-05-20 NOTE — Telephone Encounter (Signed)
Patient left a message because she need to reschedule colonoscopy 07/22/2022.  I did call patient and had to leave voicemail.  Patient in return called back. Will reschedule to 07/11/2022 due being out of town for a friend's wedding.  New instructions will be sent.  Voicemail message left for patient to return my call.

## 2022-06-05 ENCOUNTER — Telehealth: Payer: Self-pay | Admitting: Gastroenterology

## 2022-06-05 NOTE — Telephone Encounter (Addendum)
I have called patient back. Patient wanted to make sure that Dr Allen Norris is under [employer]. I have inform patient that precert did not say that she is out of network and prior Josem Kaufmann is not required.  Patient just wanted to reschedule to another date due need time to see a friend who have cancer and don't have much time left.  Reschedule to 07/18/2022.  New instructions will be sent to patient.

## 2022-06-05 NOTE — Telephone Encounter (Signed)
Pt has questions about upcoming procedure Please call back

## 2022-07-08 ENCOUNTER — Encounter: Payer: Self-pay | Admitting: Gastroenterology

## 2022-07-15 ENCOUNTER — Telehealth: Payer: Self-pay | Admitting: Gastroenterology

## 2022-07-15 NOTE — Telephone Encounter (Signed)
Spoken to patient and we had to reschedule due funeral. Patient was on the schedule for 07/18/2022.   Patient would like to reschedule to 08/08/2022.  New instructions will be sent.  Patient verbalized understanding.

## 2022-07-15 NOTE — Telephone Encounter (Signed)
Pt would like to reschedule procedure for 07/18/2022  would like a call back

## 2022-08-07 MED ORDER — PROPOFOL 1000 MG/100ML IV EMUL
INTRAVENOUS | Status: AC
  Filled 2022-08-07: qty 100

## 2022-08-07 NOTE — Anesthesia Preprocedure Evaluation (Signed)
Anesthesia Evaluation  Patient identified by MRN, date of birth, ID band Patient awake    Reviewed: Allergy & Precautions, H&P , NPO status , Patient's Chart, lab work & pertinent test results  History of Anesthesia Complications (+) PONV, Family history of anesthesia reaction and history of anesthetic complications  Airway Mallampati: II  TM Distance: >3 FB Neck ROM: Full    Dental no notable dental hx.    Pulmonary neg pulmonary ROS   Pulmonary exam normal breath sounds clear to auscultation       Cardiovascular negative cardio ROS Normal cardiovascular exam Rhythm:Regular Rate:Normal     Neuro/Psych  Headaches  negative psych ROS   GI/Hepatic Neg liver ROS,,,  Endo/Other  negative endocrine ROS    Renal/GU negative Renal ROS  negative genitourinary   Musculoskeletal  (+) Arthritis , Osteoarthritis,    Abdominal   Peds negative pediatric ROS (+)  Hematology negative hematology ROS (+)   Anesthesia Other Findings Bone spur Hyperlipemia Insomnia Heart palpitations Vaginal atrophy DES exposure in utero Osteopenia  GERD (gastroesophageal reflux disease) Arthritis Headache  PONV (postoperative nausea and vomiting)' has used scopolamine patch previously     Reproductive/Obstetrics negative OB ROS                             Anesthesia Physical Anesthesia Plan  ASA: 2  Anesthesia Plan:    Post-op Pain Management:    Induction:   PONV Risk Score and Plan:   Airway Management Planned:   Additional Equipment:   Intra-op Plan:   Post-operative Plan:   Informed Consent:   Plan Discussed with:   Anesthesia Plan Comments:        Anesthesia Quick Evaluation

## 2022-08-08 ENCOUNTER — Ambulatory Visit
Admission: RE | Admit: 2022-08-08 | Discharge: 2022-08-08 | Disposition: A | Discharge status: Home / Self Care | Payer: Medicare Other | Attending: Gastroenterology | Admitting: Gastroenterology

## 2022-08-08 ENCOUNTER — Ambulatory Visit: Payer: Medicare Other | Admitting: Anesthesiology

## 2022-08-08 ENCOUNTER — Encounter: Payer: Self-pay | Admitting: Gastroenterology

## 2022-08-08 ENCOUNTER — Other Ambulatory Visit: Payer: Self-pay

## 2022-08-08 ENCOUNTER — Ambulatory Visit
Admission: RE | Disposition: A | Discharge status: Home / Self Care | Payer: Self-pay | Source: Home / Self Care | Attending: Gastroenterology

## 2022-08-08 DIAGNOSIS — K5730 Diverticulosis of large intestine without perforation or abscess without bleeding: Secondary | ICD-10-CM | POA: Insufficient documentation

## 2022-08-08 DIAGNOSIS — K635 Polyp of colon: Secondary | ICD-10-CM | POA: Diagnosis not present

## 2022-08-08 DIAGNOSIS — D123 Benign neoplasm of transverse colon: Secondary | ICD-10-CM | POA: Diagnosis not present

## 2022-08-08 DIAGNOSIS — Z860100 Personal history of colon polyps, unspecified: Secondary | ICD-10-CM

## 2022-08-08 DIAGNOSIS — D122 Benign neoplasm of ascending colon: Secondary | ICD-10-CM | POA: Insufficient documentation

## 2022-08-08 DIAGNOSIS — Z1211 Encounter for screening for malignant neoplasm of colon: Secondary | ICD-10-CM | POA: Insufficient documentation

## 2022-08-08 DIAGNOSIS — D120 Benign neoplasm of cecum: Secondary | ICD-10-CM | POA: Diagnosis not present

## 2022-08-08 DIAGNOSIS — Z09 Encounter for follow-up examination after completed treatment for conditions other than malignant neoplasm: Secondary | ICD-10-CM | POA: Insufficient documentation

## 2022-08-08 DIAGNOSIS — D125 Benign neoplasm of sigmoid colon: Secondary | ICD-10-CM | POA: Insufficient documentation

## 2022-08-08 HISTORY — PX: COLONOSCOPY WITH PROPOFOL: SHX5780

## 2022-08-08 SURGERY — COLONOSCOPY WITH PROPOFOL
Anesthesia: General

## 2022-08-08 MED ORDER — ONDANSETRON HCL 4 MG/2ML IJ SOLN
INTRAMUSCULAR | Status: DC | PRN
  Administered 2022-08-08: 4 mg via INTRAVENOUS

## 2022-08-08 MED ORDER — STERILE WATER FOR IRRIGATION IR SOLN
Status: DC | PRN
  Administered 2022-08-08: 1000 mL

## 2022-08-08 MED ORDER — PHENYLEPHRINE 80 MCG/ML (10ML) SYRINGE FOR IV PUSH (FOR BLOOD PRESSURE SUPPORT)
PREFILLED_SYRINGE | INTRAVENOUS | Status: AC
  Filled 2022-08-08: qty 20

## 2022-08-08 MED ORDER — SODIUM CHLORIDE 0.9 % IV SOLN: INTRAVENOUS | Status: DC

## 2022-08-08 MED ORDER — PROPOFOL 10 MG/ML IV BOLUS
INTRAVENOUS | Status: DC | PRN
  Administered 2022-08-08: 20 mg via INTRAVENOUS
  Administered 2022-08-08: 30 mg via INTRAVENOUS
  Administered 2022-08-08 (×2): 40 mg via INTRAVENOUS
  Administered 2022-08-08: 30 mg via INTRAVENOUS
  Administered 2022-08-08: 80 mg via INTRAVENOUS
  Administered 2022-08-08 (×2): 40 mg via INTRAVENOUS

## 2022-08-08 MED ORDER — MIDAZOLAM HCL 2 MG/2ML IJ SOLN: 2.0000 mg | Freq: Once | INTRAMUSCULAR | Status: DC

## 2022-08-08 MED ORDER — LIDOCAINE HCL (PF) 2 % IJ SOLN
INTRAMUSCULAR | Status: AC
  Filled 2022-08-08: qty 5

## 2022-08-08 MED ORDER — LIDOCAINE HCL (CARDIAC) PF 100 MG/5ML IV SOSY
PREFILLED_SYRINGE | INTRAVENOUS | Status: DC | PRN
  Administered 2022-08-08: 50 mg via INTRAVENOUS

## 2022-08-08 MED ORDER — LACTATED RINGERS IV SOLN: INTRAVENOUS | Status: DC

## 2022-08-08 SURGICAL SUPPLY — 10 items
CLIP HMST 235XBRD CATH ROT (MISCELLANEOUS) IMPLANT
CLIP RESOLUTION 360 11X235 (MISCELLANEOUS) ×1
GOWN CVR UNV OPN BCK APRN NK (MISCELLANEOUS) ×2 IMPLANT
GOWN ISOL THUMB LOOP REG UNIV (MISCELLANEOUS) ×2
KIT PRC NS LF DISP ENDO (KITS) ×1 IMPLANT
KIT PROCEDURE OLYMPUS (KITS) ×1
MANIFOLD NEPTUNE II (INSTRUMENTS) ×1 IMPLANT
SNARE COLD EXACTO (MISCELLANEOUS) IMPLANT
TRAP ETRAP POLY (MISCELLANEOUS) IMPLANT
WATER STERILE IRR 250ML POUR (IV SOLUTION) ×1 IMPLANT

## 2022-08-08 NOTE — Transfer of Care (Signed)
Immediate Anesthesia Transfer of Care Note  Patient: Cindy Carpenter  Procedure(s) Performed: COLONOSCOPY WITH PROPOFOL WITH POLYPECTOMY  Patient Location: PACU  Anesthesia Type: No value filed.  Level of Consciousness: awake, alert  and patient cooperative  Airway and Oxygen Therapy: Patient Spontanous Breathing and Patient connected to supplemental oxygen  Post-op Assessment: Post-op Vital signs reviewed, Patient's Cardiovascular Status Stable, Respiratory Function Stable, Patent Airway and No signs of Nausea or vomiting  Post-op Vital Signs: Reviewed and stable  Complications: No notable events documented.

## 2022-08-08 NOTE — Anesthesia Postprocedure Evaluation (Signed)
Anesthesia Post Note  Patient: Cindy Carpenter  Procedure(s) Performed: COLONOSCOPY WITH PROPOFOL WITH POLYPECTOMY  Patient location during evaluation: PACU Anesthesia Type: General Level of consciousness: awake and alert Pain management: pain level controlled Vital Signs Assessment: post-procedure vital signs reviewed and stable Respiratory status: spontaneous breathing, nonlabored ventilation, respiratory function stable and patient connected to nasal cannula oxygen Cardiovascular status: blood pressure returned to baseline and stable Postop Assessment: no apparent nausea or vomiting Anesthetic complications: no   No notable events documented.   Last Vitals:  Vitals:   08/08/22 0815 08/08/22 0816  BP: (!) 95/50   Pulse: 84 84  Resp: 15 14  Temp:    SpO2: 94% 97%    Last Pain:  Vitals:   08/08/22 0815  PainSc: 0-No pain                 Sahaj Bona C Adreanne Yono

## 2022-08-08 NOTE — Op Note (Signed)
[facility] Gastroenterology Patient Name: Cindy Carpenter Procedure Date: 08/08/2022 7:17 AM MRN: [id number] Account #: [id number] Date of Birth: June 16, 1955 Admit Type: Outpatient Age: 67 Room: [facility] OR ROOM 01 Gender: Female Note Status: Finalized Instrument Name: [id number] Procedure:             Colonoscopy Indications:           High risk colon cancer surveillance: Personal history                         of colonic polyps Providers:             Midge Minium MD, MD Referring MD:          Duane Lope. Judithann Sheen, MD (Referring MD) Medicines:             Propofol per Anesthesia Complications:         No immediate complications. Procedure:             Pre-Anesthesia Assessment:                        - Prior to the procedure, a History and Physical was                         performed, and patient medications and allergies were                         reviewed. The patient's tolerance of previous                         anesthesia was also reviewed. The risks and benefits                         of the procedure and the sedation options and risks                         were discussed with the patient. All questions were                         answered, and informed consent was obtained. Prior                         Anticoagulants: The patient has taken no anticoagulant                         or antiplatelet agents. ASA Grade Assessment: II - A                         patient with mild systemic disease. After reviewing                         the risks and benefits, the patient was deemed in                         satisfactory condition to undergo the procedure.                        After obtaining informed consent, the colonoscope was  passed under direct vision. Throughout the procedure,                         the patient's blood pressure, pulse, and oxygen                         saturations were monitored continuously. The                          Colonoscope was introduced through the anus and                         advanced to the the cecum, identified by appendiceal                         orifice and ileocecal valve. The colonoscopy was                         performed without difficulty. The patient tolerated                         the procedure well. The quality of the bowel                         preparation was excellent. Findings:      The perianal and digital rectal examinations were normal.      A 3 mm polyp was found in the cecum. The polyp was sessile. The polyp       was removed with a cold snare. Resection and retrieval were complete.      Four sessile polyps were found in the ascending colon. The polyps were 3       to 7 mm in size. These polyps were removed with a cold snare. Resection       and retrieval were complete.      Two sessile polyps were found in the transverse colon. The polyps were 4       to 7 mm in size. These polyps were removed with a cold snare. Resection       and retrieval were complete.      A 6 mm polyp was found in the sigmoid colon. The polyp was pedunculated.       The polyp was removed with a cold snare. Resection and retrieval were       complete. For hemostasis, one hemostatic clip was successfully placed       (MR conditional). Clip manufacturer: [employer]. There was no       bleeding at the end of the procedure.      Multiple small-mouthed diverticula were found in the sigmoid colon. Impression:            - One 3 mm polyp in the cecum, removed with a cold                         snare. Resected and retrieved.                        - Four 3 to 7 mm polyps in the ascending colon,  removed with a cold snare. Resected and retrieved.                        - Two 4 to 7 mm polyps in the transverse colon,                         removed with a cold snare. Resected and retrieved.                        - One 6 mm polyp in the sigmoid colon, removed  with a                         cold snare. Resected and retrieved. Clip (MR                         conditional) was placed. Clip manufacturer: [employer].                        - Diverticulosis in the sigmoid colon. Recommendation:        - Discharge patient to home.                        - Resume previous diet.                        - Continue present medications.                        - Await pathology results.                        - Repeat colonoscopy in 3 years for surveillance. Procedure Code(s):     --- Professional ---                        [phone], Colonoscopy, flexible; with removal of                         tumor(s), polyp(s), or other lesion(s) by snare                         technique Diagnosis Code(s):     --- Professional ---                        Z86.010, Personal history of colonic polyps                        D12.5, Benign neoplasm of sigmoid colon CPT copyright 2022 American Medical Association. All rights reserved. The codes documented in this report are preliminary and upon coder review may  be revised to meet current compliance requirements. Midge Minium MD, MD 08/08/2022 8:05:20 AM This report has been signed electronically. Number of Addenda: 0 Note Initiated On: 08/08/2022 7:17 AM Scope Withdrawal Time: 0 hours 18 minutes 59 seconds  Total Procedure Duration: 0 hours 23 minutes 41 seconds  Estimated Blood Loss:  Estimated blood loss: none.      [facility]

## 2022-08-08 NOTE — H&P (Signed)
Midge Minium, MD [facility] [address], [address] [place], [place] [address] [phone] Fax : [phone]  Primary Care Physician:  Marguarite Arbour, MD Primary Gastroenterologist:  Dr. Servando Snare  Pre-Procedure History & Physical: HPI:  Cindy Carpenter is a 67 y.o. female is here for an colonoscopy.   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    Family history of adverse reaction to anesthesia    Mother - PONV   GERD (gastroesophageal reflux disease)    Headache    migraine - couple times per year   Heart palpitations    Hyperlipemia    Insomnia    Osteopenia    PONV (postoperative nausea and vomiting)    scopolamine has worked well in the past   Vaginal atrophy     Past Surgical History:  Procedure Laterality Date   ADENOIDECTOMY     APPENDECTOMY     ARTHROSCOPIC REPAIR ACL     BROW LIFT Bilateral 07/22/2017   Procedure: BLEPHAROPLASTY UPPER EYELID WITH EXCESS SKIN;  Surgeon: Imagene Riches, MD;  Location: [facility];  Service: Ophthalmology;  Laterality: Bilateral;   COLONOSCOPY WITH PROPOFOL N/A 09/17/2016   Procedure: COLONOSCOPY WITH PROPOFOL;  Surgeon: Midge Minium, MD;  Location: [facility] ENDOSCOPY;  Service: Endoscopy;  Laterality: N/A;   FOOT SURGERY     KNEE ARTHROPLASTY Left 08/30/2020   Procedure: COMPUTER ASSISTED TOTAL KNEE ARTHROPLASTY;  Surgeon: Donato Heinz, MD;  Location: [facility] OR[facility];  Service: Orthopedics;  Laterality: Left;   REDUCTION MAMMAPLASTY Bilateral 03/2018   ROTATOR CUFF REPAIR Left    SHOULDER ARTHROSCOPY WITH SUBACROMIAL DECOMPRESSION AND OPEN ROTATOR C Right 12/06/2019   Procedure: Right arthroscopic rotator cuff repair  Right arthroscopic extensive debridement of shoulder (glenohumeral and subacromial spaces)  Right arthroscopic subacromial decompression;  Surgeon: Signa Kell, MD;  Location: [facility] ORS;  Service: Orthopedics;  Laterality: Right;   TONSILLECTOMY       Prior to Admission medications   Medication Sig Start Date End Date Taking? Authorizing Provider  buPROPion (WELLBUTRIN XL) 300 MG 24 hr tablet Take 300 mg by mouth daily.  12/05/18  Yes [provider]  butalbital-aspirin-caffeine Benny Lennert) 50-325-40 MG capsule Take 1 capsule by mouth every 4 (four) hours as needed (migraines.). 01/05/15  Yes [provider]  Calcium Carb-Cholecalciferol (CALCIUM + D3 PO) Take 1 tablet by mouth in the morning and at bedtime.   Yes [provider]  DULoxetine (CYMBALTA) 30 MG capsule Take 30 mg by mouth daily. 11/09/19 08/08/22 Yes [provider]  fluticasone (FLONASE) 50 MCG/ACT nasal spray Place 1-2 sprays into both nostrils daily as needed (allergies.).  10/28/16  Yes [provider]  levothyroxine (SYNTHROID, LEVOTHROID) 50 MCG tablet Take 50 mcg by mouth daily before breakfast.   Yes [provider]  omeprazole (PRILOSEC) 10 MG capsule Take 10 mg by mouth daily.   Yes [provider]  raloxifene (EVISTA) 60 MG tablet Take 60 mg by mouth daily.  02/04/14  Yes [provider]  simvastatin (ZOCOR) 20 MG tablet Take 20 mg by mouth at bedtime.  02/04/14  Yes [provider]  traZODone (DESYREL) 50 MG tablet Take 50-100 mg by mouth at bedtime. 03/04/14  Yes [provider]  celecoxib (CELEBREX) 200 MG capsule Take 1 capsule (200 mg total) by mouth 2 (two) times daily. Patient not taking: Reported on 07/08/2022 08/31/20  Lasandra Beech B, PA-C  LUTEIN PO Take 1 tablet by mouth daily. Patient not taking: Reported on 07/08/2022    [provider]  solifenacin (VESICARE) 10 MG tablet Take 10 mg by mouth daily.  Patient not taking: No sig reported 10/14/19   [provider]    Allergies as of 04/26/2022 - Review Complete 08/30/2020  Allergen Reaction Noted   Morphine Nausea Only 08/31/2013   Other Anaphylaxis 01/16/2015   Penicillin g Anaphylaxis 01/16/2015    Iodinated contrast media Swelling 11/03/2014   Codeine Nausea Only 05/12/2007   Shellfish allergy Swelling 01/16/2015    Family History  Problem Relation Age of Onset   Heart disease Mother    Diabetes Mother    Heart disease Father    Diabetes Father    Hypertension Father    Diabetes Brother    Ovarian cancer Maternal Grandmother    Breast cancer Neg Hx    Colon cancer Neg Hx     Social History   Socioeconomic History   Marital status: Married    Spouse name: Not on file   Number of children: Not on file   Years of education: Not on file   Highest education level: Not on file  Occupational History   Not on file  Tobacco Use   Smoking status: Never   Smokeless tobacco: Never  Vaping Use   Vaping Use: Never used  Substance and Sexual Activity   Alcohol use: Not Currently    Comment: occas   Drug use: No   Sexual activity: Yes    Birth control/protection: Post-menopausal  Other Topics Concern   Not on file  Social History Narrative   Not on file   Social Determinants of Health   Financial Resource Strain: Not on file  Food Insecurity: Not on file  Transportation Needs: Not on file  Physical Activity: Not on file  Stress: Not on file  Social Connections: Not on file  Intimate Partner Violence: Not on file    Review of Systems: See HPI, otherwise negative ROS  Physical Exam: BP 116/79   Pulse 91   Temp 98.2 F (36.8 C)   Ht 5\' 2"  (1.575 m)   Wt 64.2 kg   SpO2 100%   BMI 25.88 kg/m  General:   Alert,  pleasant and cooperative in NAD Head:  Normocephalic and atraumatic. Neck:  Supple; no masses or thyromegaly. Lungs:  Clear throughout to auscultation.    Heart:  Regular rate and rhythm. Abdomen:  Soft, nontender and nondistended. Normal bowel sounds, without guarding, and without rebound.   Neurologic:  Alert and  oriented x4;  grossly normal neurologically.  Impression/Plan: Cindy Carpenter is here for an colonoscopy to be performed for a  history of adenomatous polyps on 2018   Risks, benefits, limitations, and alternatives regarding  colonoscopy have been reviewed with the patient.  Questions have been answered.  All parties agreeable.   Midge Minium, MD  08/08/2022, 7:21 AM

## 2022-08-12 ENCOUNTER — Encounter: Payer: Self-pay | Admitting: Gastroenterology

## 2022-08-12 LAB — SURGICAL PATHOLOGY

## 2022-08-13 ENCOUNTER — Encounter: Payer: Self-pay | Admitting: Gastroenterology

## 2022-09-12 ENCOUNTER — Encounter: Payer: Self-pay | Admitting: Obstetrics and Gynecology

## 2022-09-13 ENCOUNTER — Other Ambulatory Visit: Payer: Self-pay | Admitting: Obstetrics and Gynecology

## 2022-09-13 DIAGNOSIS — N6310 Unspecified lump in the right breast, unspecified quadrant: Secondary | ICD-10-CM

## 2022-09-13 DIAGNOSIS — N63 Unspecified lump in unspecified breast: Secondary | ICD-10-CM

## 2022-10-31 ENCOUNTER — Ambulatory Visit
Admission: RE | Admit: 2022-10-31 | Discharge: 2022-10-31 | Disposition: A | Discharge status: Home / Self Care | Payer: Medicare Other | Source: Ambulatory Visit | Attending: Obstetrics and Gynecology | Admitting: Obstetrics and Gynecology

## 2022-10-31 DIAGNOSIS — N6310 Unspecified lump in the right breast, unspecified quadrant: Secondary | ICD-10-CM

## 2023-02-26 IMAGING — DX DG KNEE 1-2V PORT*L*
2 series · 2 of 2 positions shown · non-contrast
Comparison: None.

CLINICAL DATA: Status post left knee replacement

EXAM:
PORTABLE LEFT KNEE - 2 VIEW

[knee ap]
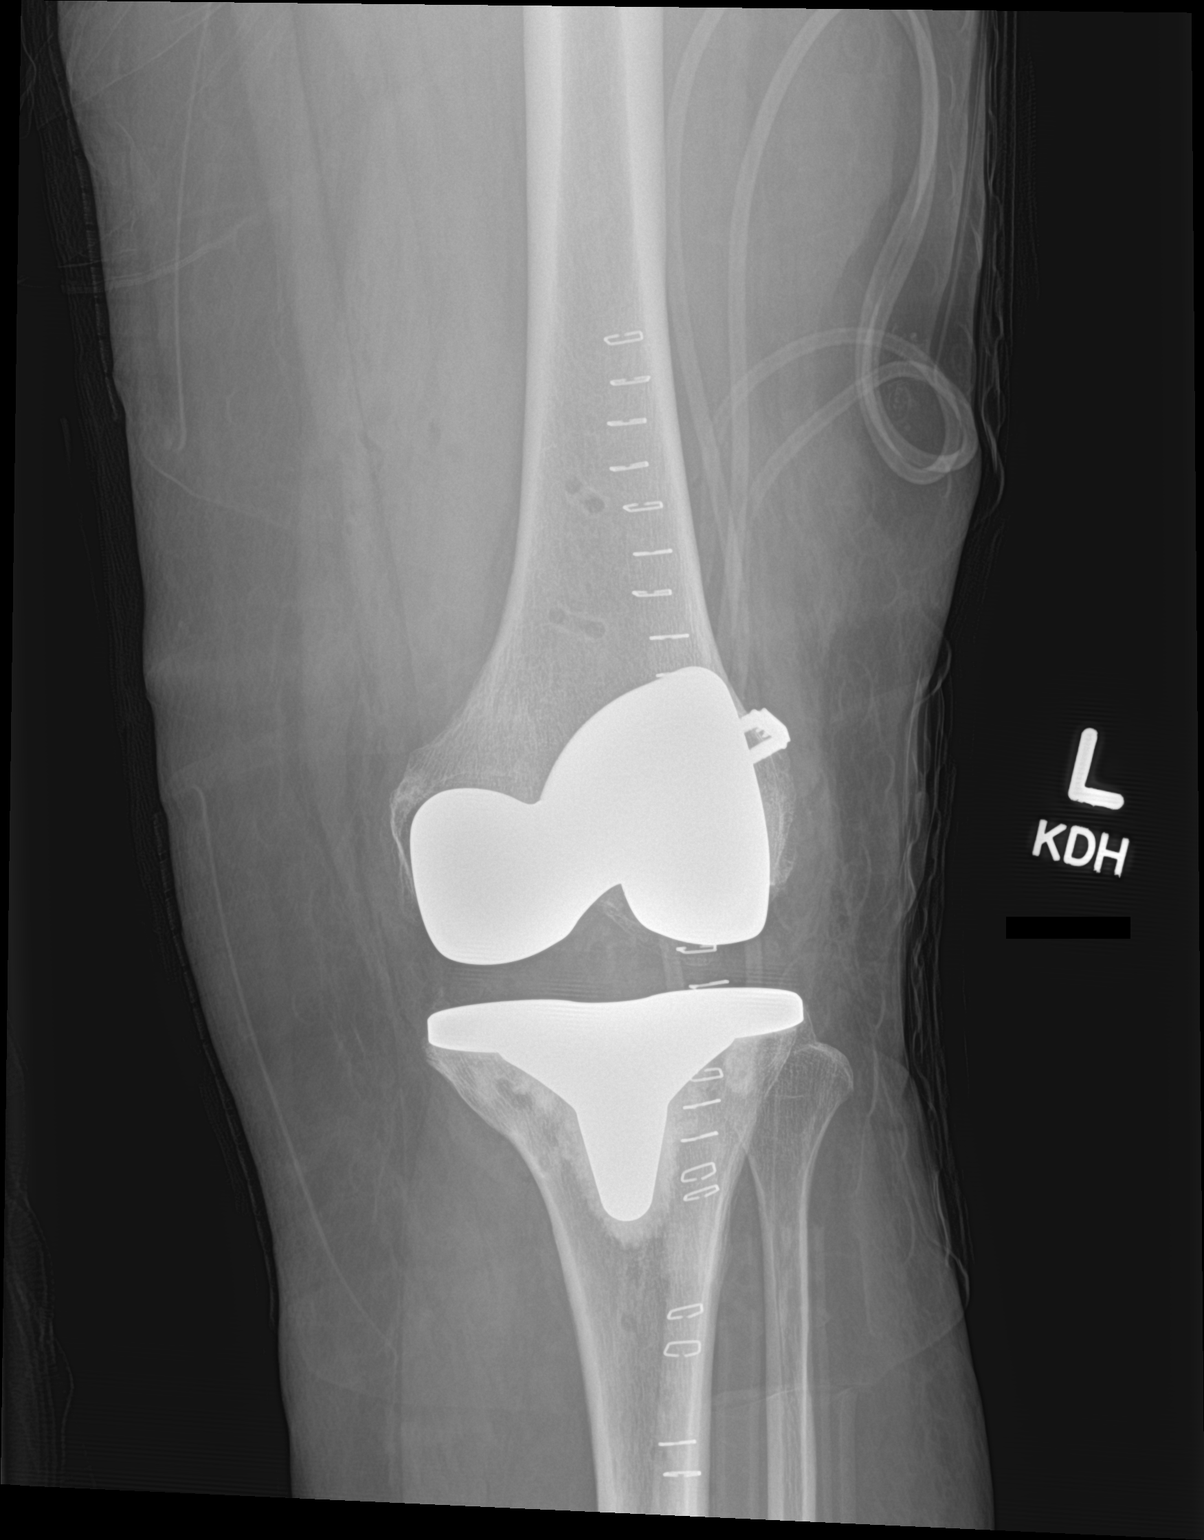

[knee lat]
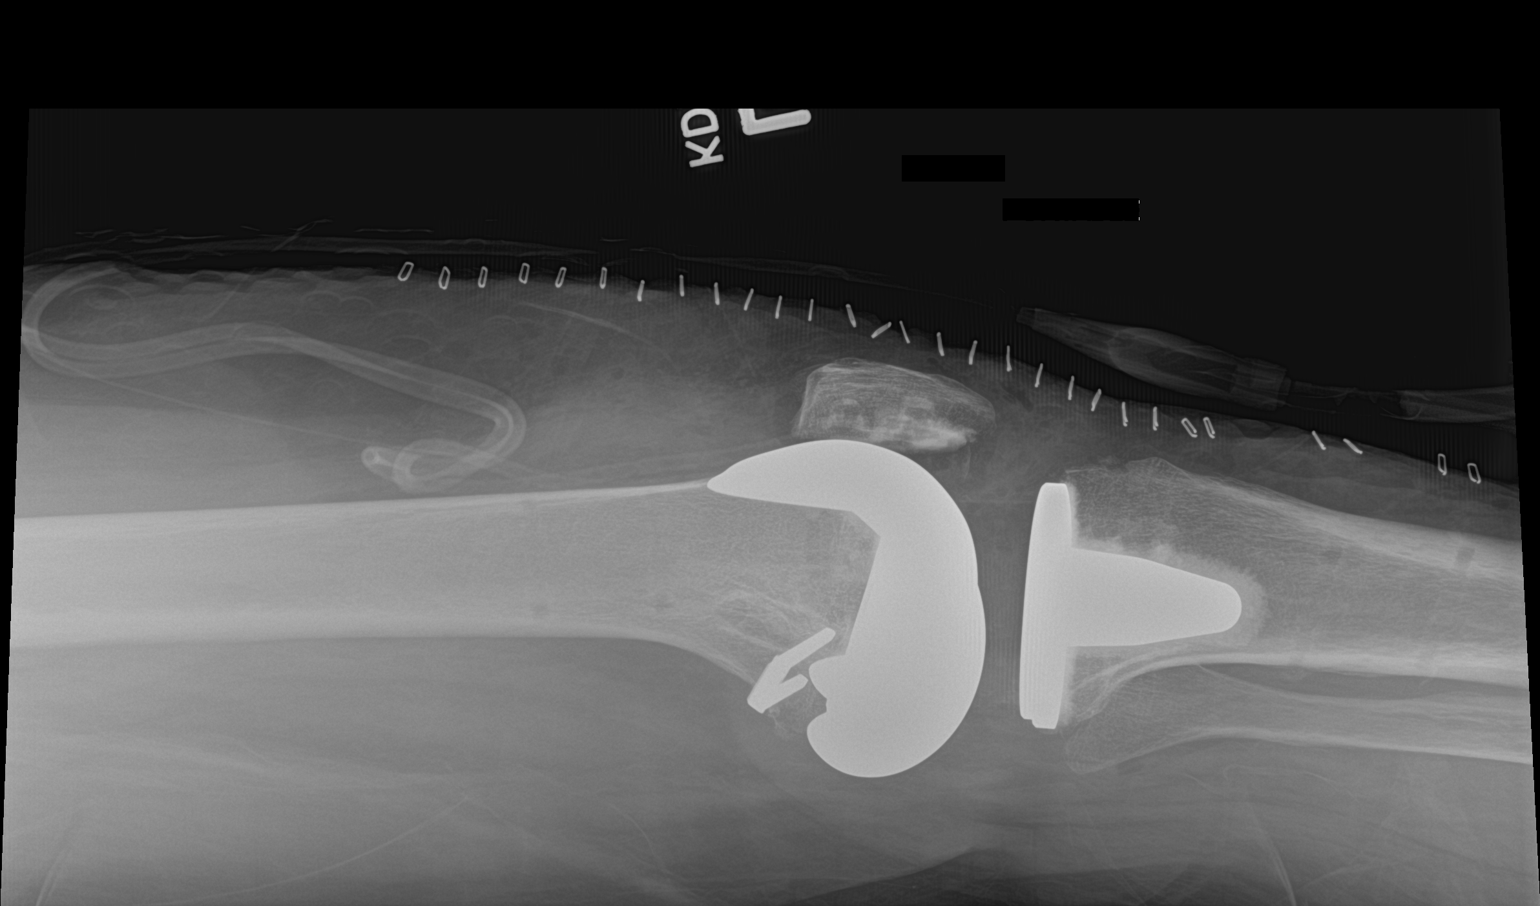

[2 of 2 positions shown; findings below may reference images not displayed]

FINDINGS: Left knee prosthesis is noted in satisfactory position. Surgical
drain is seen in place. No acute bony or soft tissue abnormality is
noted.
IMPRESSION: Status post left knee replacement.

## 2023-05-01 IMAGING — MR MR SHOULDER*R* W/O CM
4 of 5 series · 26 of 40 positions shown · non-contrast
Comparison: MRI right shoulder 04/22/2019.

CLINICAL DATA: Right shoulder pain and weakness for several years.
History of rotator cuff repair in 0607.

EXAM:
MRI OF THE RIGHT SHOULDER WITHOUT CONTRAST
TECHNIQUE: Multiplanar, multisequence MR imaging of the shoulder was performed.
No intravenous contrast was administered.

[Series 5: T2 fat-sat · axial · right · 4.0mm · 0.44mm/px · z∈[-18,+80]mm · 8 of 22 slices shown (1 of 3)]
[im 1/22]
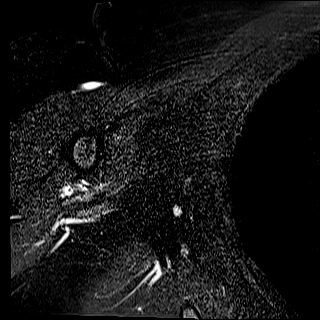
[im 4/22]
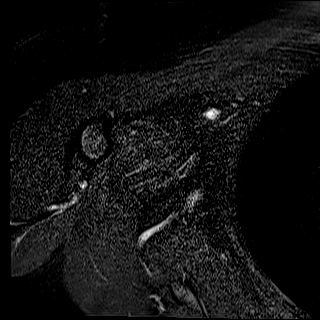
[im 7/22]
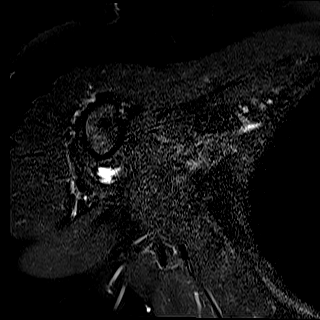
[im 10/22]
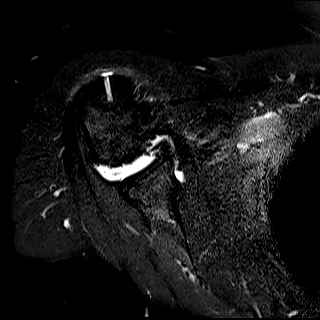
[im 13/22]
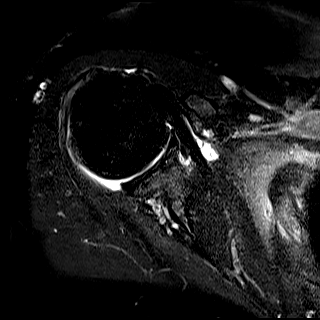
[im 16/22]
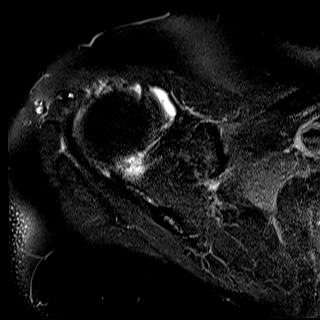
[im 19/22]
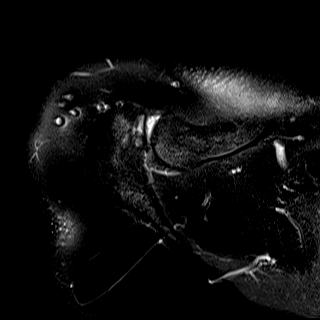
[im 22/22]
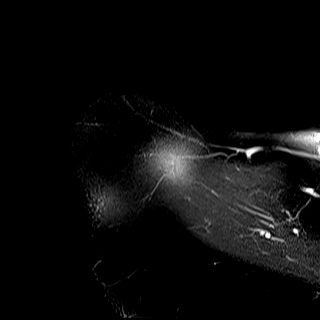

[Series 7: T2 fat-sat · oblique · right · 4.0mm · 0.22mm/px · 7 of 22 slices shown (2 of 3)]
[im 1/22]
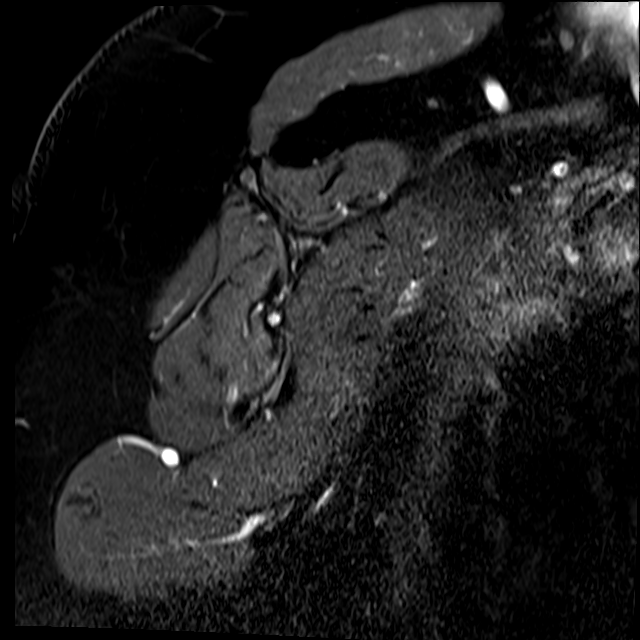
[im 4/22]
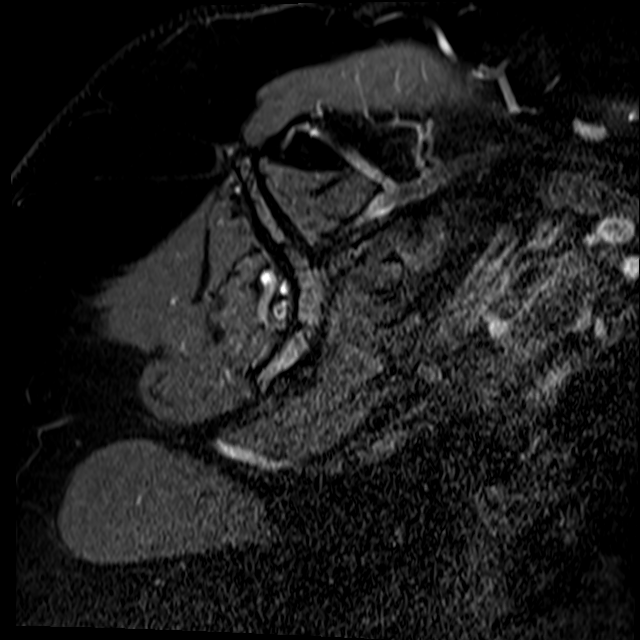
[im 7/22]
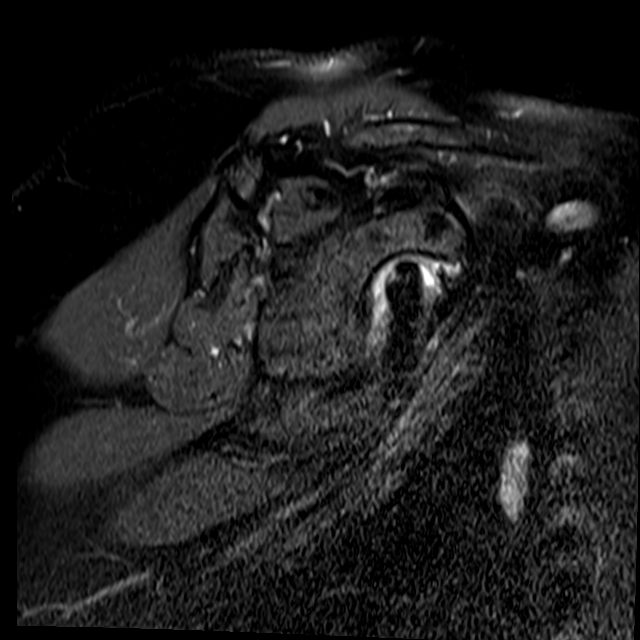
[im 10/22]
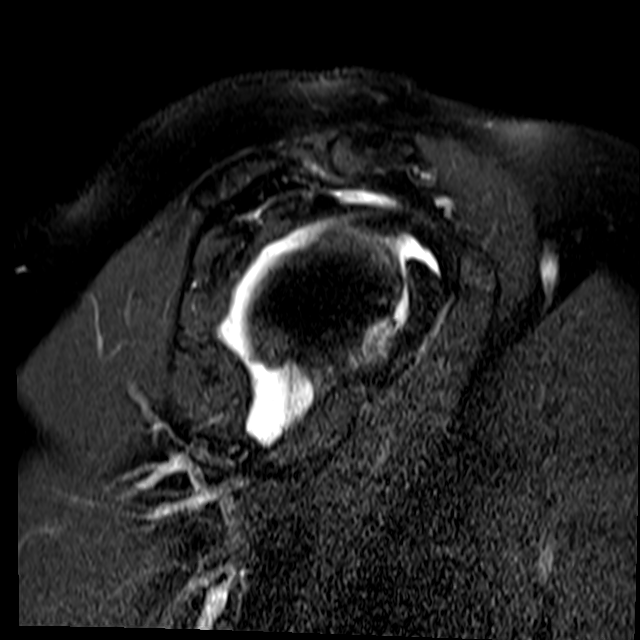
[im 13/22]
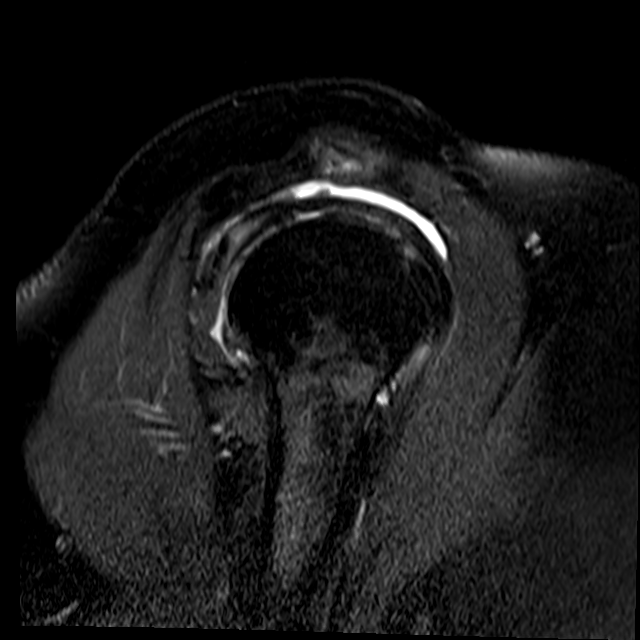
[im 16/22]
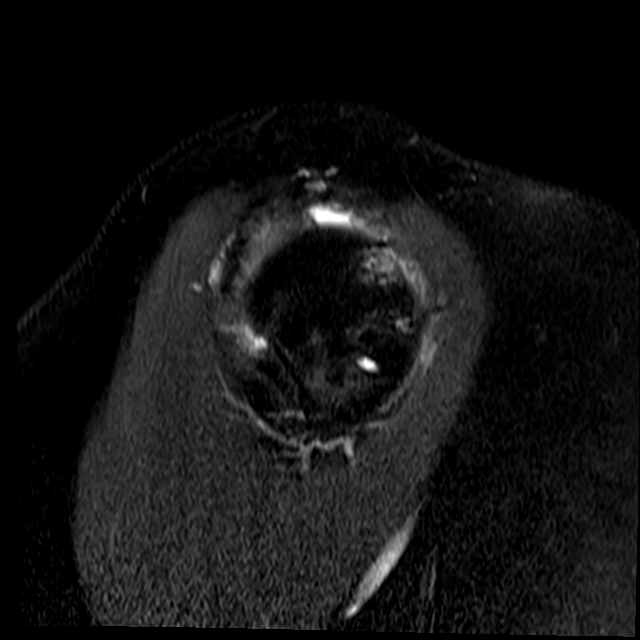
[im 19/22]
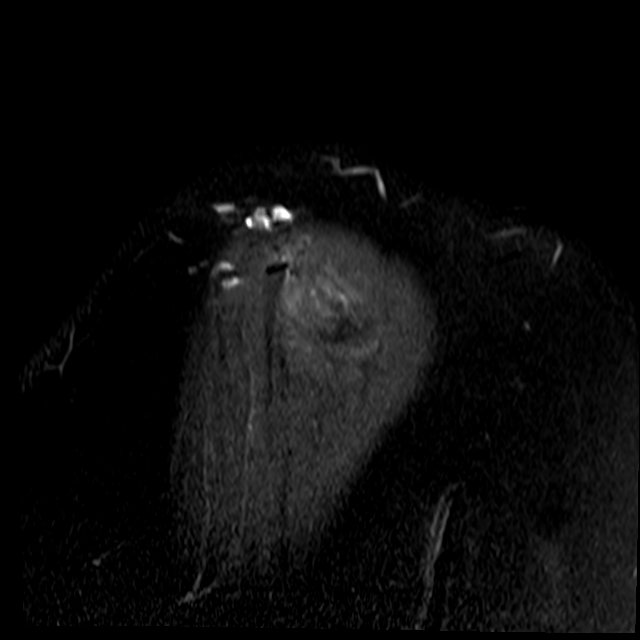

[Series 8: PD · oblique · right · 4.0mm · 0.44mm/px · 8 of 20 slices shown]
[im 1/20]
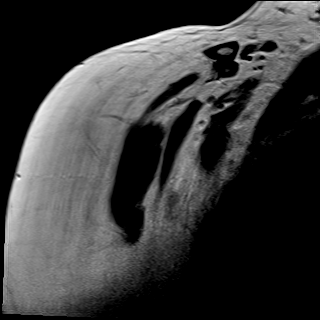
[im 3/20]
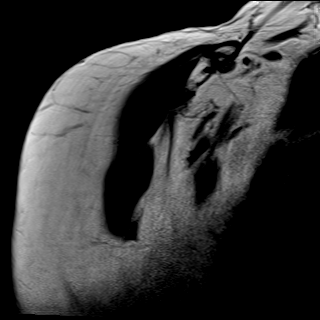
[im 6/20]
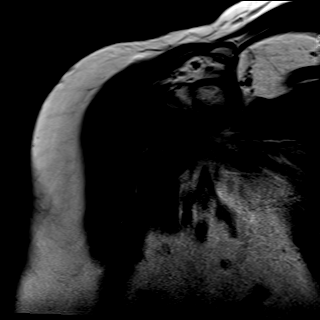
[im 9/20]
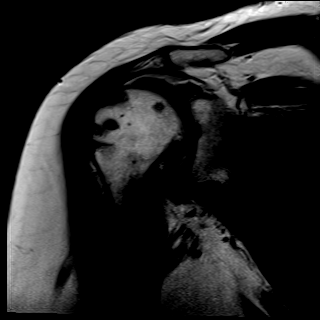
[im 11/20]
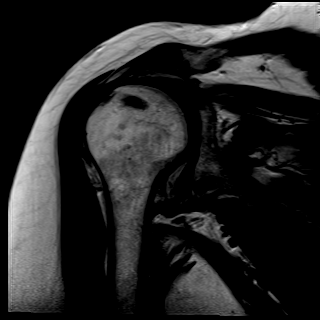
[im 14/20]
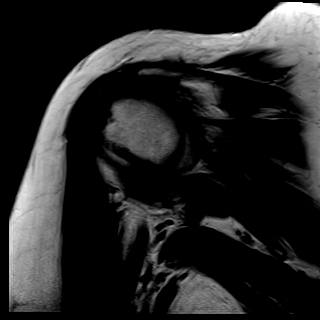
[im 17/20]
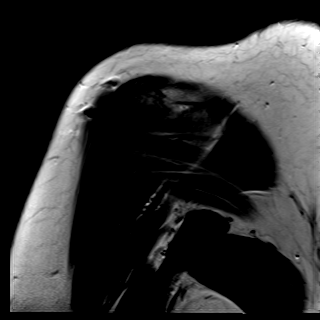
[im 20/20]
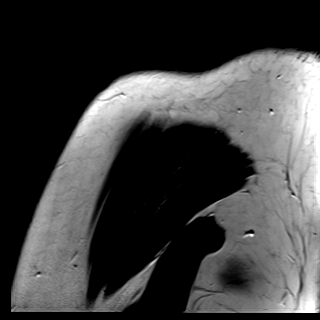

[Series 9: T2 fat-sat · oblique · right · 4.0mm · 0.44mm/px · 3 of 20 slices shown (3 of 3)]
[im 3/20]
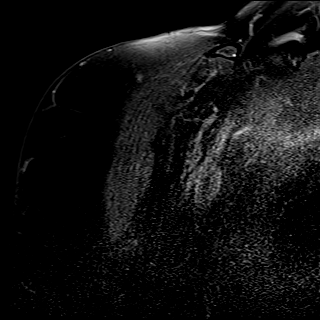
[im 11/20]
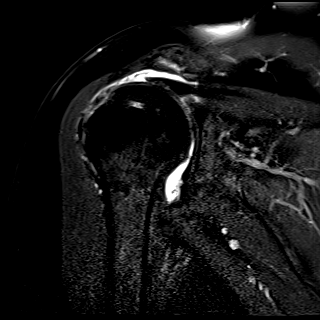
[im 17/20]
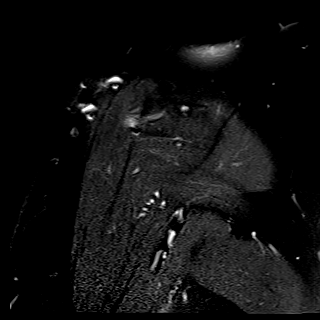

[26 of 40 positions shown; findings below may reference images not displayed]

FINDINGS: Rotator cuff: Postoperative change of rotator cuff repair again
seen. The patient has a new complete supraspinatus tear extending
into the anterior infraspinatus. Tendon retraction is 2.5-3.5 cm.
The subscapularis and teres minor are intact.

Muscles:  No atrophy or focal lesion.

Biceps long head: The tendon does not attach to the superior labrum
compatible with remote tear or tenotomy, unchanged.

Acromioclavicular Joint: Debrided without evidence of complication.
Type 1 acromion. The patient is status post acromioplasty. There is
some fluid in the subacromial/subdeltoid bursa.

Glenohumeral Joint: Negative.

Labrum:  Blunting of the superior labrum is unchanged.

Bones:  No fracture or worrisome lesion.

Other: None.
IMPRESSION: Status post rotator cuff repair. Complete tear of the supraspinatus
extending into the anterior infraspinatus is new since the prior
MRI. Retraction is 2.5-3.5 cm. No atrophy.

Remote biceps tendon tear versus biceps tenodesis, unchanged.

Fluid in the subacromial/subdeltoid bursa compatible with bursitis.

Status post debridement of the AC joint and acromioplasty.

## 2023-05-20 ENCOUNTER — Other Ambulatory Visit: Payer: Self-pay | Admitting: Internal Medicine

## 2023-05-20 DIAGNOSIS — Z1231 Encounter for screening mammogram for malignant neoplasm of breast: Secondary | ICD-10-CM

## 2023-05-22 ENCOUNTER — Ambulatory Visit
Admission: RE | Admit: 2023-05-22 | Discharge: 2023-05-22 | Disposition: A | Discharge status: Home / Self Care | Payer: Medicare Other | Source: Ambulatory Visit | Attending: Internal Medicine | Admitting: Internal Medicine

## 2023-05-22 DIAGNOSIS — Z1231 Encounter for screening mammogram for malignant neoplasm of breast: Secondary | ICD-10-CM

## 2023-06-02 ENCOUNTER — Other Ambulatory Visit: Payer: Self-pay | Admitting: Surgery

## 2023-06-02 DIAGNOSIS — M75122 Complete rotator cuff tear or rupture of left shoulder, not specified as traumatic: Secondary | ICD-10-CM

## 2023-06-02 DIAGNOSIS — M7522 Bicipital tendinitis, left shoulder: Secondary | ICD-10-CM

## 2023-06-02 DIAGNOSIS — M7582 Other shoulder lesions, left shoulder: Secondary | ICD-10-CM

## 2023-06-02 DIAGNOSIS — M7542 Impingement syndrome of left shoulder: Secondary | ICD-10-CM

## 2023-06-11 ENCOUNTER — Ambulatory Visit
Admission: RE | Admit: 2023-06-11 | Discharge: 2023-06-11 | Disposition: A | Discharge status: Home / Self Care | Payer: Medicare Other | Source: Ambulatory Visit | Attending: Surgery | Admitting: Surgery

## 2023-06-11 DIAGNOSIS — M7582 Other shoulder lesions, left shoulder: Secondary | ICD-10-CM | POA: Diagnosis present

## 2023-06-11 DIAGNOSIS — M7522 Bicipital tendinitis, left shoulder: Secondary | ICD-10-CM | POA: Diagnosis present

## 2023-06-11 DIAGNOSIS — M7542 Impingement syndrome of left shoulder: Secondary | ICD-10-CM

## 2023-06-11 DIAGNOSIS — M75122 Complete rotator cuff tear or rupture of left shoulder, not specified as traumatic: Secondary | ICD-10-CM | POA: Diagnosis present

## 2023-08-05 ENCOUNTER — Encounter: Payer: Self-pay | Admitting: Ophthalmology

## 2023-08-05 NOTE — Anesthesia Preprocedure Evaluation (Addendum)
 Anesthesia Evaluation  Patient identified by MRN, date of birth, ID band Patient awake    Reviewed: Allergy & Precautions, H&P , NPO status , Patient's Chart, lab work & pertinent test results  Airway Mallampati: II  TM Distance: >3 FB Neck ROM: full    Dental no notable dental hx.    Pulmonary neg pulmonary ROS   Pulmonary exam normal        Cardiovascular negative cardio ROS Normal cardiovascular exam     Neuro/Psych negative neurological ROS  negative psych ROS   GI/Hepatic Neg liver ROS,GERD  ,,  Endo/Other  Hypothyroidism    Renal/GU      Musculoskeletal  (+) Arthritis ,    Abdominal   Peds  Hematology negative hematology ROS (+)   Anesthesia Other Findings     Reproductive/Obstetrics negative OB ROS                             Anesthesia Physical Anesthesia Plan  ASA: 2  Anesthesia Plan: MAC   Post-op Pain Management:    Induction:   PONV Risk Score and Plan:   Airway Management Planned: Natural Airway  Additional Equipment:   Intra-op Plan:   Post-operative Plan:   Informed Consent: I have reviewed the patients History and Physical, chart, labs and discussed the procedure including the risks, benefits and alternatives for the proposed anesthesia with the patient or authorized representative who has indicated his/her understanding and acceptance.       Plan Discussed with: Anesthesiologist, CRNA and Surgeon  Anesthesia Plan Comments:         Anesthesia Quick Evaluation

## 2023-08-08 NOTE — Discharge Instructions (Signed)

## 2023-08-12 ENCOUNTER — Encounter: Payer: Self-pay | Admitting: Ophthalmology

## 2023-08-12 ENCOUNTER — Ambulatory Visit
Admission: RE | Admit: 2023-08-12 | Discharge: 2023-08-12 | Disposition: A | Discharge status: Home / Self Care | Attending: Ophthalmology | Admitting: Ophthalmology

## 2023-08-12 ENCOUNTER — Ambulatory Visit: Payer: Self-pay | Admitting: Anesthesiology

## 2023-08-12 ENCOUNTER — Encounter
Admission: RE | Disposition: A | Discharge status: Home / Self Care | Payer: Self-pay | Source: Home / Self Care | Attending: Ophthalmology

## 2023-08-12 ENCOUNTER — Other Ambulatory Visit: Payer: Self-pay

## 2023-08-12 DIAGNOSIS — Z79899 Other long term (current) drug therapy: Secondary | ICD-10-CM | POA: Insufficient documentation

## 2023-08-12 DIAGNOSIS — H2511 Age-related nuclear cataract, right eye: Secondary | ICD-10-CM | POA: Insufficient documentation

## 2023-08-12 HISTORY — PX: CATARACT EXTRACTION W/PHACO: SHX586

## 2023-08-12 SURGERY — PHACOEMULSIFICATION, CATARACT, WITH IOL INSERTION
Anesthesia: Monitor Anesthesia Care | Site: Eye | Laterality: Right

## 2023-08-12 MED ORDER — SIGHTPATH DOSE#1 BSS IO SOLN
INTRAOCULAR | Status: DC | PRN
  Administered 2023-08-12: 15 mL via INTRAOCULAR

## 2023-08-12 MED ORDER — SIGHTPATH DOSE#1 NA CHONDROIT SULF-NA HYALURON 40-17 MG/ML IO SOLN
INTRAOCULAR | Status: DC | PRN
  Administered 2023-08-12: 1 mL via INTRAOCULAR

## 2023-08-12 MED ORDER — LIDOCAINE HCL (PF) 2 % IJ SOLN
INTRAOCULAR | Status: DC | PRN
  Administered 2023-08-12: 2 mL

## 2023-08-12 MED ORDER — ARMC OPHTHALMIC DILATING DROPS
OPHTHALMIC | Status: AC
  Filled 2023-08-12: qty 0.5

## 2023-08-12 MED ORDER — FENTANYL CITRATE (PF) 100 MCG/2ML IJ SOLN
INTRAMUSCULAR | Status: AC
  Filled 2023-08-12: qty 2

## 2023-08-12 MED ORDER — BRIMONIDINE TARTRATE-TIMOLOL 0.2-0.5 % OP SOLN
OPHTHALMIC | Status: DC | PRN
  Administered 2023-08-12: 1 [drp] via OPHTHALMIC

## 2023-08-12 MED ORDER — SIGHTPATH DOSE#1 BSS IO SOLN
INTRAOCULAR | Status: DC | PRN
  Administered 2023-08-12: 52 mL via OPHTHALMIC

## 2023-08-12 MED ORDER — MIDAZOLAM HCL 2 MG/2ML IJ SOLN
INTRAMUSCULAR | Status: DC | PRN
  Administered 2023-08-12: 2 mg via INTRAVENOUS

## 2023-08-12 MED ORDER — TETRACAINE HCL 0.5 % OP SOLN
1.0000 [drp] | OPHTHALMIC | Status: DC | PRN
  Administered 2023-08-12 (×3): 1 [drp] via OPHTHALMIC

## 2023-08-12 MED ORDER — MIDAZOLAM HCL 2 MG/2ML IJ SOLN
INTRAMUSCULAR | Status: AC
  Filled 2023-08-12: qty 2

## 2023-08-12 MED ORDER — TETRACAINE HCL 0.5 % OP SOLN
OPHTHALMIC | Status: AC
  Filled 2023-08-12: qty 4

## 2023-08-12 MED ORDER — ARMC OPHTHALMIC DILATING DROPS
1.0000 | OPHTHALMIC | Status: DC | PRN
  Administered 2023-08-12 (×3): 1 via OPHTHALMIC

## 2023-08-12 MED ORDER — FENTANYL CITRATE (PF) 100 MCG/2ML IJ SOLN
INTRAMUSCULAR | Status: DC | PRN
  Administered 2023-08-12: 50 ug via INTRAVENOUS

## 2023-08-12 MED ORDER — MOXIFLOXACIN HCL 0.5 % OP SOLN
OPHTHALMIC | Status: DC | PRN
  Administered 2023-08-12: .2 mL via OPHTHALMIC

## 2023-08-12 SURGICAL SUPPLY — 13 items
CATARACT SUITE SIGHTPATH (MISCELLANEOUS) ×1 IMPLANT
CYSTOTOME ANGL RVRS SHRT 25G (CUTTER) ×1 IMPLANT
CYSTOTOME ANGL RVRS SHRT 25GA (CUTTER) ×1 IMPLANT
FEE CATARACT SUITE SIGHTPATH (MISCELLANEOUS) ×1 IMPLANT
GLOVE BIOGEL PI IND STRL 8 (GLOVE) ×1 IMPLANT
GLOVE SURG LX STRL 8.0 MICRO (GLOVE) ×1 IMPLANT
GLOVE SURG PROTEXIS BL SZ6.5 (GLOVE) ×1 IMPLANT
GLOVE SURG SYN 6.5 PF PI BL (GLOVE) ×1 IMPLANT
LENS CLAREON VIVITY IOL 22.5 ×1 IMPLANT
LENS IOL CLRN VT YLW 22.5 IMPLANT
NDL FILTER BLUNT 18X1 1/2 (NEEDLE) ×1 IMPLANT
NEEDLE FILTER BLUNT 18X1 1/2 (NEEDLE) ×1 IMPLANT
SYR 3ML LL SCALE MARK (SYRINGE) ×1 IMPLANT

## 2023-08-12 NOTE — Transfer of Care (Signed)
 Immediate Anesthesia Transfer of Care Note  Patient: Alison Applebaum  Procedure(s) Performed: PHACOEMULSIFICATION, CATARACT, WITH IOL INSERTION 3.93 00:29.8 (Right: Eye)  Patient Location: PACU  Anesthesia Type: MAC  Level of Consciousness: awake, alert  and patient cooperative  Airway and Oxygen Therapy: Patient Spontanous Breathing and Patient connected to supplemental oxygen  Post-op Assessment: Post-op Vital signs reviewed, Patient's Cardiovascular Status Stable, Respiratory Function Stable, Patent Airway and No signs of Nausea or vomiting  Post-op Vital Signs: Reviewed and stable  Complications: No notable events documented.

## 2023-08-12 NOTE — Op Note (Signed)
 PREOPERATIVE DIAGNOSIS:  Nuclear sclerotic cataract of the right eye.   POSTOPERATIVE DIAGNOSIS:  Right Eye Cataract   OPERATIVE PROCEDURE:ORPROCALL@   SURGEON:  Clair Crews, MD.   ANESTHESIA:  Anesthesiologist: Baltazar Bonier, MD CRNA: Sherrlyn Dolores, CRNA  1.      Managed anesthesia care. 2.      0.4ml of Shugarcaine was instilled in the eye following the paracentesis.   COMPLICATIONS:  None.   TECHNIQUE:   Stop and chop   DESCRIPTION OF PROCEDURE:  The patient was examined and consented in the preoperative holding area where the aforementioned topical anesthesia was applied to the right eye and then brought back to the Operating Room where the right eye was prepped and draped in the usual sterile ophthalmic fashion and a lid speculum was placed. A paracentesis was created with the side port blade and the anterior chamber was filled with viscoelastic. A near clear corneal incision was performed with the steel keratome. A continuous curvilinear capsulorrhexis was performed with a cystotome followed by the capsulorrhexis forceps. Hydrodissection and hydrodelineation were carried out with BSS on a blunt cannula. The lens was removed in a stop and chop  technique and the remaining cortical material was removed with the irrigation-aspiration handpiece. The capsular bag was inflated with viscoelastic and the CNWET0 lens was placed in the capsular bag without complication. The remaining viscoelastic was removed from the eye with the irrigation-aspiration handpiece. The wounds were hydrated. The anterior chamber was flushed with BSS and the eye was inflated to physiologic pressure. 0.1ml of Vigamox was placed in the anterior chamber. The wounds were found to be water  tight. The eye was dressed with Combigan. The patient was given protective glasses to wear throughout the day and a shield with which to sleep tonight. The patient was also given drops with which to begin a drop regimen today  and will follow-up with me in one day. Implant Name Type Inv. Item Serial No. Manufacturer Lot No. LRB No. Used Action  LENS CLAREON VIVITY IOL 22.5 - [id number]  LENS CLAREON VIVITY IOL 22.5 [id number] [employer]  Right 1 Implanted   Procedure(s): PHACOEMULSIFICATION, CATARACT, WITH IOL INSERTION 3.93 00:29.8 (Right)  Electronically signed: Clair Crews 08/12/2023 9:34 AM

## 2023-08-12 NOTE — H&P (Signed)
 [facility]   Primary Care Physician:  Yehuda Helms, MD Ophthalmologist: Dr. Merrell Abate  Pre-Procedure History & Physical: HPI:  Cindy Carpenter is a 68 y.o. female here for cataract surgery.   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    Family history of adverse reaction to anesthesia    Mother - PONV   GERD (gastroesophageal reflux disease)    Headache    migraine - couple times per year   Heart palpitations    Hyperlipemia    Insomnia    Osteopenia    PONV (postoperative nausea and vomiting)    scopolamine  has worked well in the past   Vaginal atrophy     Past Surgical History:  Procedure Laterality Date   ADENOIDECTOMY     APPENDECTOMY     ARTHROSCOPIC REPAIR ACL     BROW LIFT Bilateral 07/22/2017   Procedure: BLEPHAROPLASTY UPPER EYELID WITH EXCESS SKIN;  Surgeon: Zacarias Hermann, MD;  Location: [facility];  Service: Ophthalmology;  Laterality: Bilateral;   COLONOSCOPY WITH PROPOFOL  N/A 09/17/2016   Procedure: COLONOSCOPY WITH PROPOFOL ;  Surgeon: Marnee Sink, MD;  Location: [facility] ENDOSCOPY;  Service: Endoscopy;  Laterality: N/A;   COLONOSCOPY WITH PROPOFOL  N/A 08/08/2022   Procedure: COLONOSCOPY WITH PROPOFOL  WITH POLYPECTOMY;  Surgeon: Marnee Sink, MD;  Location: [facility];  Service: Endoscopy;  Laterality: N/A;   FOOT SURGERY     KNEE ARTHROPLASTY Left 08/30/2020   Procedure: COMPUTER ASSISTED TOTAL KNEE ARTHROPLASTY;  Surgeon: Arlyne Lame, MD;  Location: [facility];  Service: Orthopedics;  Laterality: Left;   REDUCTION MAMMAPLASTY Bilateral 03/2018   ROTATOR CUFF REPAIR Left    SHOULDER ARTHROSCOPY WITH SUBACROMIAL DECOMPRESSION AND OPEN ROTATOR C Right 12/06/2019   Procedure: Right arthroscopic rotator cuff repair  Right arthroscopic extensive debridement of shoulder (glenohumeral and subacromial spaces)  Right arthroscopic subacromial decompression;  Surgeon: Lorri Rota,  MD;  Location: [facility] OR[facility];  Service: Orthopedics;  Laterality: Right;   TONSILLECTOMY      Prior to Admission medications   Medication Sig Start Date End Date Taking? Authorizing Provider  alendronate (FOSAMAX) 70 MG tablet Take 70 mg by mouth once a week. Take with a full glass of water  on an empty stomach.   Yes [provider]  buPROPion  (WELLBUTRIN  XL) 300 MG 24 hr tablet Take 300 mg by mouth daily.  12/05/18  Yes [provider]  Calcium  Carb-Cholecalciferol  (CALCIUM  + D3 PO) Take 1 tablet by mouth in the morning and at bedtime.   Yes [provider]  cyanocobalamin (VITAMIN B12) 1000 MCG tablet Take 1,000 mcg by mouth daily.   Yes [provider]  DULoxetine  (CYMBALTA ) 30 MG capsule Take 30 mg by mouth daily. 11/09/19 08/05/23 Yes [provider]  fluticasone  (FLONASE ) 50 MCG/ACT nasal spray Place 1-2 sprays into both nostrils daily as needed (allergies.).  10/28/16  Yes [provider]  levothyroxine  (SYNTHROID , LEVOTHROID) 50 MCG tablet Take 50 mcg by mouth daily before breakfast.   Yes [provider]  omeprazole (PRILOSEC) 10 MG capsule Take 10 mg by mouth daily.   Yes [provider]  phentermine 37.5 MG capsule Take 37.5 mg by mouth every morning.   Yes [provider]  simvastatin  (ZOCOR ) 20 MG tablet Take 20 mg by mouth at bedtime.  02/04/14  Yes [provider]  traZODone  (DESYREL ) 50 MG tablet  Take 50-100 mg by mouth at bedtime. 03/04/14  Yes [provider]  butalbital -aspirin -caffeine  (FIORINAL) 50-325-40 MG capsule Take 1 capsule by mouth every 4 (four) hours as needed (migraines.). Patient not taking: Reported on 08/05/2023 01/05/15   [provider]    Allergies as of 07/28/2023 - Review Complete 08/08/2022  Allergen Reaction Noted   Morphine Nausea Only 08/31/2013   Other Anaphylaxis 01/16/2015   Penicillin g Anaphylaxis 01/16/2015   Iodinated contrast media Swelling  11/03/2014   Codeine Nausea Only 05/12/2007   Shellfish allergy Swelling 01/16/2015    Family History  Problem Relation Age of Onset   Heart disease Mother    Diabetes Mother    Heart disease Father    Diabetes Father    Hypertension Father    Diabetes Brother    Ovarian cancer Maternal Grandmother    Breast cancer Neg Hx    Colon cancer Neg Hx     Social History   Socioeconomic History   Marital status: Married    Spouse name: Not on file   Number of children: Not on file   Years of education: Not on file   Highest education level: Not on file  Occupational History   Not on file  Tobacco Use   Smoking status: Never   Smokeless tobacco: Never  Vaping Use   Vaping status: Never Used  Substance and Sexual Activity   Alcohol use: Not Currently    Comment: occas   Drug use: No   Sexual activity: Yes    Birth control/protection: Post-menopausal  Other Topics Concern   Not on file  Social History Narrative   Not on file   Social Drivers of Health   Financial Resource Strain: Not on file  Food Insecurity: Not on file  Transportation Needs: Not on file  Physical Activity: Not on file  Stress: Not on file  Social Connections: Not on file  Intimate Partner Violence: Not on file    Review of Systems: See HPI, otherwise negative ROS  Physical Exam: BP (!) 124/90   Pulse 69   Temp (!) 97.2 F (36.2 C) (Temporal)   Resp 15   Ht 5\' 2"  (1.575 m)   Wt 71.3 kg   SpO2 100%   BMI 28.73 kg/m  General:   Alert, cooperative. Head:  Normocephalic and atraumatic. Respiratory:  Normal work of breathing. Cardiovascular:  NAD  Impression/Plan: Cindy Carpenter is here for cataract surgery.  Risks, benefits, limitations, and alternatives regarding cataract surgery have been reviewed with the patient.  Questions have been answered.  All parties agreeable.   Clair Crews, MD  08/12/2023, 9:10 AM

## 2023-08-12 NOTE — Anesthesia Postprocedure Evaluation (Signed)
 Anesthesia Post Note  Patient: Alison Applebaum  Procedure(s) Performed: PHACOEMULSIFICATION, CATARACT, WITH IOL INSERTION 3.93 00:29.8 (Right: Eye)  Patient location during evaluation: PACU Anesthesia Type: MAC Level of consciousness: awake and alert Pain management: pain level controlled Vital Signs Assessment: post-procedure vital signs reviewed and stable Respiratory status: spontaneous breathing, nonlabored ventilation and respiratory function stable Cardiovascular status: stable and blood pressure returned to baseline Postop Assessment: no apparent nausea or vomiting Anesthetic complications: no   No notable events documented.   Last Vitals:  Vitals:   08/12/23 0936 08/12/23 0941  BP: 116/86 136/73  Pulse: 69 62  Resp: 14 16  Temp: (!) 36.1 C (!) 36.1 C  SpO2: 95% 94%    Last Pain:  Vitals:   08/12/23 0941  TempSrc:   PainSc: 0-No pain                 Baltazar Bonier

## 2023-08-18 ENCOUNTER — Encounter: Payer: Self-pay | Admitting: Ophthalmology

## 2023-08-18 NOTE — Anesthesia Preprocedure Evaluation (Addendum)
 Anesthesia Evaluation  Patient identified by MRN, date of birth, ID band Patient awake    Reviewed: Allergy & Precautions, H&P , NPO status , Patient's Chart, lab work & pertinent test results  History of Anesthesia Complications (+) PONV, Family history of anesthesia reaction and history of anesthetic complications  Airway Mallampati: II  TM Distance: >3 FB Neck ROM: Full    Dental no notable dental hx.    Pulmonary neg pulmonary ROS   Pulmonary exam normal breath sounds clear to auscultation       Cardiovascular negative cardio ROS Normal cardiovascular exam Rhythm:Regular Rate:Normal     Neuro/Psych  Headaches  Neuromuscular disease negative neurological ROS  negative psych ROS   GI/Hepatic negative GI ROS, Neg liver ROS,GERD  ,,  Endo/Other  negative endocrine ROSHypothyroidism    Renal/GU negative Renal ROS  negative genitourinary   Musculoskeletal negative musculoskeletal ROS (+) Arthritis ,    Abdominal   Peds negative pediatric ROS (+)  Hematology negative hematology ROS (+)   Anesthesia Other Findings Previous cataract surgery 08-12-23 Dr. Lincoln Renshaw  Bone spur  Hyperlipemia Insomnia  Heart palpitations Vaginal atrophy  DES exposure in utero Osteopenia  GERD (gastroesophageal reflux disease) Family history of adverse reaction to anesthesia  Arthritis Headache  PONV (postoperative nausea and vomiting)--plan zofran  4 mv IV preop    Reproductive/Obstetrics negative OB ROS                             Anesthesia Physical Anesthesia Plan  ASA: 2  Anesthesia Plan: MAC   Post-op Pain Management:    Induction: Intravenous  PONV Risk Score and Plan:   Airway Management Planned: Natural Airway and Nasal Cannula  Additional Equipment:   Intra-op Plan:   Post-operative Plan:   Informed Consent: I have reviewed the patients History and Physical, chart, labs and  discussed the procedure including the risks, benefits and alternatives for the proposed anesthesia with the patient or authorized representative who has indicated his/her understanding and acceptance.     Dental Advisory Given  Plan Discussed with: Anesthesiologist, CRNA and Surgeon  Anesthesia Plan Comments: (Patient consented for risks of anesthesia including but not limited to:  - adverse reactions to medications - damage to eyes, teeth, lips or other oral mucosa - nerve damage due to positioning  - sore throat or hoarseness - Damage to heart, brain, nerves, lungs, other parts of body or loss of life  Patient voiced understanding and assent.)        Anesthesia Quick Evaluation

## 2023-08-21 NOTE — Discharge Instructions (Signed)

## 2023-08-26 ENCOUNTER — Ambulatory Visit
Admission: RE | Admit: 2023-08-26 | Discharge: 2023-08-26 | Disposition: A | Discharge status: Home / Self Care | Attending: Ophthalmology | Admitting: Ophthalmology

## 2023-08-26 ENCOUNTER — Ambulatory Visit: Payer: Self-pay | Admitting: Anesthesiology

## 2023-08-26 ENCOUNTER — Encounter: Payer: Self-pay | Admitting: Ophthalmology

## 2023-08-26 ENCOUNTER — Encounter
Admission: RE | Disposition: A | Discharge status: Home / Self Care | Payer: Self-pay | Source: Home / Self Care | Attending: Ophthalmology

## 2023-08-26 ENCOUNTER — Other Ambulatory Visit: Payer: Self-pay

## 2023-08-26 DIAGNOSIS — H2512 Age-related nuclear cataract, left eye: Secondary | ICD-10-CM | POA: Diagnosis present

## 2023-08-26 DIAGNOSIS — K219 Gastro-esophageal reflux disease without esophagitis: Secondary | ICD-10-CM | POA: Diagnosis not present

## 2023-08-26 HISTORY — PX: CATARACT EXTRACTION W/PHACO: SHX586

## 2023-08-26 SURGERY — PHACOEMULSIFICATION, CATARACT, WITH IOL INSERTION
Anesthesia: Monitor Anesthesia Care | Site: Eye | Laterality: Left

## 2023-08-26 MED ORDER — ARMC OPHTHALMIC DILATING DROPS
1.0000 | OPHTHALMIC | Status: DC | PRN
  Administered 2023-08-26 (×3): 1 via OPHTHALMIC

## 2023-08-26 MED ORDER — TETRACAINE HCL 0.5 % OP SOLN
OPHTHALMIC | Status: AC
  Filled 2023-08-26: qty 4

## 2023-08-26 MED ORDER — ONDANSETRON HCL 4 MG/2ML IJ SOLN
4.0000 mg | Freq: Once | INTRAMUSCULAR | Status: AC
  Administered 2023-08-26: 4 mg via INTRAVENOUS

## 2023-08-26 MED ORDER — TETRACAINE HCL 0.5 % OP SOLN
1.0000 [drp] | OPHTHALMIC | Status: DC | PRN
  Administered 2023-08-26 (×3): 1 [drp] via OPHTHALMIC

## 2023-08-26 MED ORDER — MIDAZOLAM HCL 2 MG/2ML IJ SOLN
INTRAMUSCULAR | Status: DC | PRN
  Administered 2023-08-26 (×2): 1 mg via INTRAVENOUS

## 2023-08-26 MED ORDER — SIGHTPATH DOSE#1 BSS IO SOLN
INTRAOCULAR | Status: DC | PRN
  Administered 2023-08-26: 48 mL via OPHTHALMIC

## 2023-08-26 MED ORDER — FENTANYL CITRATE (PF) 100 MCG/2ML IJ SOLN
INTRAMUSCULAR | Status: DC | PRN
  Administered 2023-08-26 (×2): 50 ug via INTRAVENOUS

## 2023-08-26 MED ORDER — BRIMONIDINE TARTRATE-TIMOLOL 0.2-0.5 % OP SOLN
OPHTHALMIC | Status: DC | PRN
  Administered 2023-08-26: 1 [drp] via OPHTHALMIC

## 2023-08-26 MED ORDER — SIGHTPATH DOSE#1 BSS IO SOLN
INTRAOCULAR | Status: DC | PRN
  Administered 2023-08-26: 15 mL via INTRAOCULAR

## 2023-08-26 MED ORDER — MOXIFLOXACIN HCL 0.5 % OP SOLN
OPHTHALMIC | Status: DC | PRN
  Administered 2023-08-26: .2 mL via OPHTHALMIC

## 2023-08-26 MED ORDER — LIDOCAINE HCL (PF) 2 % IJ SOLN
INTRAOCULAR | Status: DC | PRN
  Administered 2023-08-26: 2 mL

## 2023-08-26 MED ORDER — ONDANSETRON HCL 4 MG/2ML IJ SOLN
INTRAMUSCULAR | Status: AC
  Filled 2023-08-26: qty 2

## 2023-08-26 MED ORDER — MIDAZOLAM HCL 2 MG/2ML IJ SOLN
INTRAMUSCULAR | Status: AC
  Filled 2023-08-26: qty 2

## 2023-08-26 MED ORDER — ARMC OPHTHALMIC DILATING DROPS
OPHTHALMIC | Status: AC
  Filled 2023-08-26: qty 0.5

## 2023-08-26 MED ORDER — SIGHTPATH DOSE#1 NA CHONDROIT SULF-NA HYALURON 40-17 MG/ML IO SOLN
INTRAOCULAR | Status: DC | PRN
  Administered 2023-08-26: 1 mL via INTRAOCULAR

## 2023-08-26 MED ORDER — FENTANYL CITRATE (PF) 100 MCG/2ML IJ SOLN
INTRAMUSCULAR | Status: AC
  Filled 2023-08-26: qty 2

## 2023-08-26 SURGICAL SUPPLY — 12 items
CATARACT SUITE SIGHTPATH (MISCELLANEOUS) ×1 IMPLANT
CYSTOTOME ANGL RVRS SHRT 25G (CUTTER) ×1 IMPLANT
CYSTOTOME ANGL RVRS SHRT 25GA (CUTTER) ×1 IMPLANT
FEE CATARACT SUITE SIGHTPATH (MISCELLANEOUS) ×1 IMPLANT
GLOVE BIOGEL PI IND STRL 8 (GLOVE) ×1 IMPLANT
GLOVE SURG LX STRL 8.0 MICRO (GLOVE) ×1 IMPLANT
GLOVE SURG PROTEXIS BL SZ6.5 (GLOVE) ×1 IMPLANT
GLOVE SURG SYN 6.5 PF PI BL (GLOVE) ×1 IMPLANT
LENS IOL CLRN VT TRC 3 22.5 IMPLANT
NDL FILTER BLUNT 18X1 1/2 (NEEDLE) ×1 IMPLANT
NEEDLE FILTER BLUNT 18X1 1/2 (NEEDLE) ×1 IMPLANT
SYR 3ML LL SCALE MARK (SYRINGE) ×1 IMPLANT

## 2023-08-26 NOTE — Anesthesia Postprocedure Evaluation (Signed)
 Anesthesia Post Note  Patient: Cindy Carpenter  Procedure(s) Performed: PHACOEMULSIFICATION, CATARACT, WITH IOL INSERTION 9.59 00:48.1 (Left: Eye)  Patient location during evaluation: PACU Anesthesia Type: MAC Level of consciousness: awake and alert Pain management: pain level controlled Vital Signs Assessment: post-procedure vital signs reviewed and stable Respiratory status: spontaneous breathing, nonlabored ventilation, respiratory function stable and patient connected to nasal cannula oxygen Cardiovascular status: stable and blood pressure returned to baseline Postop Assessment: no apparent nausea or vomiting Anesthetic complications: no   No notable events documented.   Last Vitals:  Vitals:   08/26/23 0820 08/26/23 0824  BP: 122/81 107/62  Pulse: 73 82  Resp: 13 16  Temp:  (!) 36.3 C  SpO2: 93% 96%    Last Pain:  Vitals:   08/26/23 0824  PainSc: 0-No pain                 Genine Beckett C Plumer Mittelstaedt

## 2023-08-26 NOTE — H&P (Signed)
 [facility]   Primary Care Physician:  Yehuda Helms, MD Ophthalmologist: Dr. Jeb Miner  Pre-Procedure History & Physical: HPI:  Cindy Carpenter is a 68 y.o. female here for cataract surgery.   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    Family history of adverse reaction to anesthesia    Mother - PONV   GERD (gastroesophageal reflux disease)    Headache    migraine - couple times per year   Heart palpitations    Hyperlipemia    Insomnia    Osteopenia    PONV (postoperative nausea and vomiting)    scopolamine  has worked well in the past   Vaginal atrophy     Past Surgical History:  Procedure Laterality Date   ADENOIDECTOMY     APPENDECTOMY     ARTHROSCOPIC REPAIR ACL     BROW LIFT Bilateral 07/22/2017   Procedure: BLEPHAROPLASTY UPPER EYELID WITH EXCESS SKIN;  Surgeon: Zacarias Hermann, MD;  Location: [facility];  Service: Ophthalmology;  Laterality: Bilateral;   CATARACT EXTRACTION W/PHACO Right 08/12/2023   Procedure: PHACOEMULSIFICATION, CATARACT, WITH IOL INSERTION 3.93 00:29.8;  Surgeon: Clair Crews, MD;  Location: [facility];  Service: Ophthalmology;  Laterality: Right;   COLONOSCOPY WITH PROPOFOL  N/A 09/17/2016   Procedure: COLONOSCOPY WITH PROPOFOL ;  Surgeon: Marnee Sink, MD;  Location: [facility];  Service: Endoscopy;  Laterality: N/A;   COLONOSCOPY WITH PROPOFOL  N/A 08/08/2022   Procedure: COLONOSCOPY WITH PROPOFOL  WITH POLYPECTOMY;  Surgeon: Marnee Sink, MD;  Location: [facility];  Service: Endoscopy;  Laterality: N/A;   FOOT SURGERY     KNEE ARTHROPLASTY Left 08/30/2020   Procedure: COMPUTER ASSISTED TOTAL KNEE ARTHROPLASTY;  Surgeon: Arlyne Lame, MD;  Location: [facility];  Service: Orthopedics;  Laterality: Left;   REDUCTION MAMMAPLASTY Bilateral 03/2018   ROTATOR CUFF REPAIR Left    SHOULDER ARTHROSCOPY WITH SUBACROMIAL DECOMPRESSION AND OPEN ROTATOR C  Right 12/06/2019   Procedure: Right arthroscopic rotator cuff repair  Right arthroscopic extensive debridement of shoulder (glenohumeral and subacromial spaces)  Right arthroscopic subacromial decompression;  Surgeon: Lorri Rota, MD;  Location: [facility];  Service: Orthopedics;  Laterality: Right;   TONSILLECTOMY      Prior to Admission medications   Medication Sig Start Date End Date Taking? Authorizing Provider  alendronate (FOSAMAX) 70 MG tablet Take 70 mg by mouth once a week. Take with a full glass of water  on an empty stomach.   Yes [provider]  buPROPion  (WELLBUTRIN  XL) 300 MG 24 hr tablet Take 300 mg by mouth daily.  12/05/18  Yes [provider]  Calcium  Carb-Cholecalciferol  (CALCIUM  + D3 PO) Take 1 tablet by mouth in the morning and at bedtime.   Yes [provider]  cyanocobalamin (VITAMIN B12) 1000 MCG tablet Take 1,000 mcg by mouth daily.   Yes [provider]  DULoxetine  (CYMBALTA ) 30 MG capsule Take 30 mg by mouth daily. 11/09/19 08/26/23 Yes [provider]  fluticasone  (FLONASE ) 50 MCG/ACT nasal spray Place 1-2 sprays into both nostrils daily as needed (allergies.).  10/28/16  Yes [provider]  levothyroxine  (SYNTHROID , LEVOTHROID) 50 MCG tablet Take 50 mcg by mouth daily before breakfast.   Yes [provider]  omeprazole (PRILOSEC) 10 MG capsule Take 10 mg by mouth daily.   Yes [provider]  phentermine 37.5 MG capsule Take 37.5 mg by mouth every  morning.   Yes [provider]  simvastatin  (ZOCOR ) 20 MG tablet Take 20 mg by mouth at bedtime.  02/04/14  Yes [provider]  traZODone  (DESYREL ) 50 MG tablet Take 50-100 mg by mouth at bedtime. 03/04/14  Yes [provider]  butalbital -aspirin -caffeine  (FIORINAL) 50-325-40 MG capsule Take 1 capsule by mouth every 4 (four) hours as needed (migraines.). Patient not taking: Reported on 08/05/2023 01/05/15   [provider]     Allergies as of 07/28/2023 - Review Complete 08/08/2022  Allergen Reaction Noted   Morphine Nausea Only 08/31/2013   Other Anaphylaxis 01/16/2015   Penicillin g Anaphylaxis 01/16/2015   Iodinated contrast media Swelling 11/03/2014   Codeine Nausea Only 05/12/2007   Shellfish allergy Swelling 01/16/2015    Family History  Problem Relation Age of Onset   Heart disease Mother    Diabetes Mother    Heart disease Father    Diabetes Father    Hypertension Father    Diabetes Brother    Ovarian cancer Maternal Grandmother    Breast cancer Neg Hx    Colon cancer Neg Hx     Social History   Socioeconomic History   Marital status: Married    Spouse name: Not on file   Number of children: Not on file   Years of education: Not on file   Highest education level: Not on file  Occupational History   Not on file  Tobacco Use   Smoking status: Never   Smokeless tobacco: Never  Vaping Use   Vaping status: Never Used  Substance and Sexual Activity   Alcohol use: Not Currently    Comment: occas   Drug use: No   Sexual activity: Yes    Birth control/protection: Post-menopausal  Other Topics Concern   Not on file  Social History Narrative   Not on file   Social Drivers of Health   Financial Resource Strain: Not on file  Food Insecurity: Not on file  Transportation Needs: Not on file  Physical Activity: Not on file  Stress: Not on file  Social Connections: Not on file  Intimate Partner Violence: Not on file    Review of Systems: See HPI, otherwise negative ROS  Physical Exam: BP 110/85   Pulse 84   Temp 97.9 F (36.6 C)   Ht 5\' 2"  (1.575 m)   Wt 71.7 kg   SpO2 100%   BMI 28.90 kg/m  General:   Alert, cooperative. Head:  Normocephalic and atraumatic. Respiratory:  Normal work of breathing. Cardiovascular:  NAD  Impression/Plan: Cindy Carpenter is here for cataract surgery.  Risks, benefits, limitations, and alternatives regarding cataract surgery have  been reviewed with the patient.  Questions have been answered.  All parties agreeable.   Clair Crews, MD  08/26/2023, 7:51 AM

## 2023-08-26 NOTE — Op Note (Signed)
 PREOPERATIVE DIAGNOSIS:  Nuclear sclerotic cataract of the left eye.   POSTOPERATIVE DIAGNOSIS:  Nuclear sclerotic cataract of the left eye.   OPERATIVE PROCEDURE: Procedure(s): PHACOEMULSIFICATION, CATARACT, WITH IOL INSERTION 9.59 00:48.1   SURGEON:  Clair Crews, MD.   ANESTHESIA: 1.      Managed anesthesia care. 2.     0.65ml os Shugarcaine was instilled following the paracentesis [email or web address]   COMPLICATIONS:  None.   TECHNIQUE:   Stop and chop    DESCRIPTION OF PROCEDURE:  The patient was examined and consented in the preoperative holding area where the aforementioned topical anesthesia was applied to the left eye.  The patient was brought back to the Operating Room where he was sat upright on the gurney and given a target to fixate upon while the eye was marked at the 3:00 and 9:00 position.  The patient was then reclined on the operating table.  The eye was prepped and draped in the usual sterile ophthalmic fashion and a lid speculum was placed. A paracentesis was created with the side port blade and the anterior chamber was filled with viscoelastic. A near clear corneal incision was performed with the steel keratome. A continuous curvilinear capsulorrhexis was performed with a cystotome followed by the capsulorrhexis forceps. Hydrodissection and hydrodelineation were carried out with BSS on a blunt cannula. The lens was removed in a stop and chop technique and the remaining cortical material was removed with the irrigation-aspiration handpiece. The eye was inflated with viscoelastic and the ZCT lens was placed in the eye and rotated to within a few degrees of the predetermined orientation.  The remaining viscoelastic was removed from the eye.  The Sinskey hook was used to rotate the toric lens into its final resting place at 004 degrees.  0.1 ml of Vigamox  was placed in the anterior chamber. The eye was inflated to a physiologic pressure and found to be watertight.  The eye was dressed  with Combigan . The patient was given protective glasses to wear throughout the day and a shield with which to sleep tonight. The patient was also given drops with which to begin a drop regimen today and will follow-up with me in one day. Implant Name Type Inv. Item Serial No. Manufacturer Lot No. LRB No. Used Action  LENS IOL CLRN VT TRC 3 22.5 - [id number]  LENS IOL CLRN VT TRC 3 22.5 [id number] [employer]  Left 1 Implanted   Procedure(s): PHACOEMULSIFICATION, CATARACT, WITH IOL INSERTION 9.59 00:48.1 (Left)  Electronically signed: Clair Crews 5/20/20258:17 AM

## 2023-08-26 NOTE — Transfer of Care (Signed)
 Immediate Anesthesia Transfer of Care Note  Patient: Cindy Carpenter  Procedure(s) Performed: PHACOEMULSIFICATION, CATARACT, WITH IOL INSERTION 9.59 00:48.1 (Left: Eye)  Patient Location: PACU  Anesthesia Type: MAC  Level of Consciousness: awake, alert  and patient cooperative  Airway and Oxygen Therapy: Patient Spontanous Breathing and Patient connected to supplemental oxygen  Post-op Assessment: Post-op Vital signs reviewed, Patient's Cardiovascular Status Stable, Respiratory Function Stable, Patent Airway and No signs of Nausea or vomiting  Post-op Vital Signs: Reviewed and stable  Complications: No notable events documented.

## 2024-02-11 ENCOUNTER — Other Ambulatory Visit: Payer: Self-pay | Admitting: Internal Medicine

## 2024-02-11 ENCOUNTER — Encounter: Payer: Self-pay | Admitting: Internal Medicine

## 2024-02-11 DIAGNOSIS — E7800 Pure hypercholesterolemia, unspecified: Secondary | ICD-10-CM

## 2024-02-11 DIAGNOSIS — Z8669 Personal history of other diseases of the nervous system and sense organs: Secondary | ICD-10-CM

## 2024-02-11 DIAGNOSIS — Z0000 Encounter for general adult medical examination without abnormal findings: Secondary | ICD-10-CM

## 2024-02-11 DIAGNOSIS — G44221 Chronic tension-type headache, intractable: Secondary | ICD-10-CM

## 2024-02-11 DIAGNOSIS — H539 Unspecified visual disturbance: Secondary | ICD-10-CM

## 2024-03-02 ENCOUNTER — Ambulatory Visit
Admission: RE | Admit: 2024-03-02 | Discharge: 2024-03-02 | Disposition: A | Discharge status: Home / Self Care | Payer: Self-pay | Source: Ambulatory Visit | Attending: Internal Medicine | Admitting: Internal Medicine

## 2024-03-02 ENCOUNTER — Ambulatory Visit

## 2024-03-02 ENCOUNTER — Ambulatory Visit
Admission: RE | Admit: 2024-03-02 | Discharge: 2024-03-02 | Disposition: A | Discharge status: Home / Self Care | Source: Ambulatory Visit | Attending: Internal Medicine | Admitting: Internal Medicine

## 2024-03-02 ENCOUNTER — Ambulatory Visit: Admission: RE | Admit: 2024-03-02

## 2024-03-02 DIAGNOSIS — G44221 Chronic tension-type headache, intractable: Secondary | ICD-10-CM | POA: Insufficient documentation

## 2024-03-02 DIAGNOSIS — Z0000 Encounter for general adult medical examination without abnormal findings: Secondary | ICD-10-CM | POA: Insufficient documentation

## 2024-03-02 DIAGNOSIS — Z8669 Personal history of other diseases of the nervous system and sense organs: Secondary | ICD-10-CM | POA: Diagnosis present

## 2024-03-02 DIAGNOSIS — H539 Unspecified visual disturbance: Secondary | ICD-10-CM | POA: Insufficient documentation

## 2024-03-02 DIAGNOSIS — E7800 Pure hypercholesterolemia, unspecified: Secondary | ICD-10-CM | POA: Insufficient documentation

## 2024-03-03 ENCOUNTER — Other Ambulatory Visit (INDEPENDENT_AMBULATORY_CARE_PROVIDER_SITE_OTHER): Payer: Self-pay | Admitting: Nurse Practitioner

## 2024-03-03 DIAGNOSIS — I83813 Varicose veins of bilateral lower extremities with pain: Secondary | ICD-10-CM

## 2024-03-08 ENCOUNTER — Other Ambulatory Visit: Payer: Self-pay | Admitting: Surgery

## 2024-03-08 DIAGNOSIS — M7581 Other shoulder lesions, right shoulder: Secondary | ICD-10-CM

## 2024-03-08 DIAGNOSIS — Z9889 Other specified postprocedural states: Secondary | ICD-10-CM

## 2024-03-09 ENCOUNTER — Ambulatory Visit
Admission: RE | Admit: 2024-03-09 | Discharge: 2024-03-09 | Disposition: A | Source: Ambulatory Visit | Attending: Surgery

## 2024-03-09 DIAGNOSIS — M7581 Other shoulder lesions, right shoulder: Secondary | ICD-10-CM | POA: Insufficient documentation

## 2024-03-09 DIAGNOSIS — Z9889 Other specified postprocedural states: Secondary | ICD-10-CM | POA: Insufficient documentation

## 2024-03-12 ENCOUNTER — Encounter (INDEPENDENT_AMBULATORY_CARE_PROVIDER_SITE_OTHER): Payer: Self-pay

## 2024-03-12 ENCOUNTER — Encounter (INDEPENDENT_AMBULATORY_CARE_PROVIDER_SITE_OTHER): Payer: Self-pay | Admitting: Vascular Surgery

## 2024-03-22 ENCOUNTER — Other Ambulatory Visit (INDEPENDENT_AMBULATORY_CARE_PROVIDER_SITE_OTHER): Payer: Self-pay

## 2024-03-22 ENCOUNTER — Ambulatory Visit (INDEPENDENT_AMBULATORY_CARE_PROVIDER_SITE_OTHER): Payer: Self-pay | Admitting: Vascular Surgery

## 2024-03-22 ENCOUNTER — Encounter (INDEPENDENT_AMBULATORY_CARE_PROVIDER_SITE_OTHER): Payer: Self-pay | Admitting: Vascular Surgery

## 2024-03-22 VITALS — BP 139/84 | HR 72 | Resp 18 | Ht 62.0 in | Wt 150.6 lb

## 2024-03-22 DIAGNOSIS — M5416 Radiculopathy, lumbar region: Secondary | ICD-10-CM | POA: Diagnosis not present

## 2024-03-22 DIAGNOSIS — R55 Syncope and collapse: Secondary | ICD-10-CM | POA: Diagnosis not present

## 2024-03-22 DIAGNOSIS — I8310 Varicose veins of unspecified lower extremity with inflammation: Secondary | ICD-10-CM | POA: Diagnosis not present

## 2024-03-22 DIAGNOSIS — E7800 Pure hypercholesterolemia, unspecified: Secondary | ICD-10-CM | POA: Diagnosis not present

## 2024-03-22 DIAGNOSIS — I83813 Varicose veins of bilateral lower extremities with pain: Secondary | ICD-10-CM

## 2024-03-23 ENCOUNTER — Other Ambulatory Visit (INDEPENDENT_AMBULATORY_CARE_PROVIDER_SITE_OTHER)

## 2024-03-23 ENCOUNTER — Other Ambulatory Visit (INDEPENDENT_AMBULATORY_CARE_PROVIDER_SITE_OTHER): Payer: Self-pay | Admitting: Vascular Surgery

## 2024-03-23 ENCOUNTER — Encounter (INDEPENDENT_AMBULATORY_CARE_PROVIDER_SITE_OTHER): Payer: Self-pay | Admitting: Vascular Surgery

## 2024-03-23 DIAGNOSIS — R55 Syncope and collapse: Secondary | ICD-10-CM | POA: Insufficient documentation

## 2024-03-23 DIAGNOSIS — I8310 Varicose veins of unspecified lower extremity with inflammation: Secondary | ICD-10-CM | POA: Insufficient documentation

## 2024-03-23 NOTE — Progress Notes (Signed)
 MRN : [id number]  Cindy Carpenter is a 68 y.o. (06/19/1955) female who presents with chief complaint of varicose veins hurt.  History of Present Illness:   The patient is seen for evaluation of symptomatic varicose veins. The patient relates burning and stinging which worsened steadily throughout the course of the day, particularly with standing. The patient also notes an aching and throbbing pain over the varicosities, particularly with prolonged dependent positions. The symptoms are significantly improved with elevation.  The patient also notes that during hot weather the symptoms are greatly intensified. The patient states the pain from the varicose veins interferes with work, daily exercise, shopping and household maintenance. At this point, the symptoms are persistent and severe enough that they're having a negative impact on lifestyle and are interfering with daily activities.  There is no history of DVT, PE or superficial thrombophlebitis. There is no history of ulceration or hemorrhage. The patient denies a significant family history of varicose veins.  The patient has not worn graduated compression in the past. At the present time the patient has not been using over-the-counter analgesics. There is no history of prior surgical intervention or sclerotherapy.  Today the patient is also noting that she becomes profoundly dizzy when she leans forward.  She describes this as a lightheadedness but not the room spinning.  This is a relatively new difficulty for her recurring over the past month or 2.  No history of trauma or recent viral infections or sinus infections.    Duplex ultrasound of the venous system bilateral lower extremities obtained today demonstrates normal deep venous system bilaterally.  On the right there is evidence of superficial reflux in the accessory saphenous vein.  The great saphenous vein is competent.  On the left the great saphenous vein is incompetent  throughout its entire course.  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    Family history of adverse reaction to anesthesia    Mother - PONV   GERD (gastroesophageal reflux disease)    Headache    migraine - couple times per year   Heart palpitations    Hyperlipemia    Insomnia    Osteopenia    PONV (postoperative nausea and vomiting)    scopolamine  has worked well in the past   Vaginal atrophy     Past Surgical History:  Procedure Laterality Date   ADENOIDECTOMY     APPENDECTOMY     ARTHROSCOPIC REPAIR ACL     BROW LIFT Bilateral 07/22/2017   Procedure: BLEPHAROPLASTY UPPER EYELID WITH EXCESS SKIN;  Surgeon: Ashley Greig HERO, MD;  Location: [facility];  Service: Ophthalmology;  Laterality: Bilateral;   CATARACT EXTRACTION W/PHACO Right 08/12/2023   Procedure: PHACOEMULSIFICATION, CATARACT, WITH IOL INSERTION 3.93 00:29.8;  Surgeon: Jaye Fallow, MD;  Location: [facility];  Service: Ophthalmology;  Laterality: Right;   CATARACT EXTRACTION W/PHACO Left 08/26/2023   Procedure: PHACOEMULSIFICATION, CATARACT, WITH IOL INSERTION 9.59 00:48.1;  Surgeon: Jaye Fallow, MD;  Location: [facility];  Service: Ophthalmology;  Laterality: Left;   COLONOSCOPY WITH PROPOFOL  N/A 09/17/2016   Procedure: COLONOSCOPY WITH PROPOFOL ;  Surgeon: Jinny Carmine, MD;  Location: [facility];  Service: Endoscopy;  Laterality: N/A;   COLONOSCOPY WITH PROPOFOL  N/A 08/08/2022   Procedure: COLONOSCOPY WITH PROPOFOL  WITH POLYPECTOMY;  Surgeon: Jinny,  Darren, MD;  Location: [facility];  Service: Endoscopy;  Laterality: N/A;   FOOT SURGERY     KNEE ARTHROPLASTY Left 08/30/2020   Procedure: COMPUTER ASSISTED TOTAL KNEE ARTHROPLASTY;  Surgeon: Mardee Lynwood SQUIBB, MD;  Location: [facility];  Service: Orthopedics;  Laterality: Left;   REDUCTION MAMMAPLASTY Bilateral 03/2018   ROTATOR CUFF REPAIR  Left    SHOULDER ARTHROSCOPY WITH SUBACROMIAL DECOMPRESSION AND OPEN ROTATOR C Right 12/06/2019   Procedure: Right arthroscopic rotator cuff repair  Right arthroscopic extensive debridement of shoulder (glenohumeral and subacromial spaces)  Right arthroscopic subacromial decompression;  Surgeon: Tobie Priest, MD;  Location: [facility];  Service: Orthopedics;  Laterality: Right;   TONSILLECTOMY      Social History Social History[2]  Family History Family History  Problem Relation Age of Onset   Heart disease Mother    Diabetes Mother    Heart disease Father    Diabetes Father    Hypertension Father    Diabetes Brother    Ovarian cancer Maternal Grandmother    Breast cancer Neg Hx    Colon cancer Neg Hx     Allergies[3]   REVIEW OF SYSTEMS (Negative unless checked)  Constitutional: [] Weight loss  [] Fever  [] Chills Cardiac: [] Chest pain   [] Chest pressure   [] Palpitations   [] Shortness of breath when laying flat   [] Shortness of breath with exertion. Vascular:  [] Pain in legs with walking   [x] Pain in legs with standing  [] History of DVT   [] Phlebitis   [] Swelling in legs   [x] Varicose veins   [] Non-healing ulcers Pulmonary:   [] Uses home oxygen   [] Productive cough   [] Hemoptysis   [] Wheeze  [] COPD   [] Asthma Neurologic:  [] Dizziness   [] Seizures   [] History of stroke   [] History of TIA  [] Aphasia   [] Vissual changes   [] Weakness or numbness in arm   [] Weakness or numbness in leg Musculoskeletal:   [] Joint swelling   [] Joint pain   [] Low back pain Hematologic:  [] Easy bruising  [] Easy bleeding   [] Hypercoagulable state   [] Anemic Gastrointestinal:  [] Diarrhea   [] Vomiting  [] Gastroesophageal reflux/heartburn   [] Difficulty swallowing. Genitourinary:  [] Chronic kidney disease   [] Difficult urination  [] Frequent urination   [] Blood in urine Skin:  [] Rashes   [] Ulcers  Psychological:  [] History of anxiety   []  History of major depression.  Physical Examination  Vitals:   03/22/24  1504  BP: 139/84  Pulse: 72  Resp: 18  Weight: 150 lb 9.6 oz (68.3 kg)  Height: 5' 2 (1.575 m)   Body mass index is 27.55 kg/m. Gen: WD/WN, NAD Head: [place]/AT, No temporalis wasting.  Ear/Nose/Throat: Hearing grossly intact, nares w/o erythema or drainage, pinna without lesions Eyes: PER, EOMI, sclera nonicteric.  Neck: Supple, no gross masses.  No JVD.  Pulmonary:  Good air movement, no audible wheezing, no use of accessory muscles.  Cardiac: RRR, precordium not hyperdynamic. Vascular:  Large varicosities present, greater than 10 mm left lower extremity.  The right lower extremity demonstrates moderate size varicosities in the anterior thigh and medial thigh.  Veins are tender to palpation  Mild venous stasis changes to the legs bilaterally.  Trace soft pitting edema CEAP C3sEpAsPr Vessel Right Left  Radial Palpable Palpable  Gastrointestinal: soft, non-distended. No guarding/no peritoneal signs.  Musculoskeletal: M/S 5/5 throughout.  No deformity.  Neurologic: CN 2-12 intact. Pain and light touch intact in extremities.  Symmetrical.  Speech is fluent. Motor exam as listed above. Psychiatric: Judgment intact, Mood &  affect appropriate for pt's clinical situation. Dermatologic: Venous rashes no ulcers noted.  No changes consistent with cellulitis. Lymph : No lichenification or skin changes of chronic lymphedema.  CBC Lab Results  Component Value Date   WBC 5.0 08/23/2020   HGB 12.9 08/23/2020   HCT 39.5 08/23/2020   MCV 91.4 08/23/2020   PLT 228 08/23/2020    BMET    Component Value Date/Time   NA 142 08/23/2020 0924   K 3.6 08/23/2020 0924   CL 107 08/23/2020 0924   CO2 27 08/23/2020 0924   GLUCOSE 105 (H) 08/23/2020 0924   BUN 14 08/23/2020 0924   CREATININE 0.60 01/02/2021 1001   CALCIUM  9.1 08/23/2020 0924   GFRNONAA >60 08/23/2020 0924   CrCl cannot be calculated (Patient's most recent lab result is older than the maximum 21 days allowed.).  COAG Lab Results   Component Value Date   INR 0.9 08/23/2020    Radiology VAS US  LOWER EXTREMITY VENOUS REFLUX Result Date: 03/22/2024  Lower Venous Reflux Study Patient Name:  SKARLET LYONS  Date of Exam:   03/22/2024 Medical Rec #: [id number]         Accession #:    [id number] Date of Birth: 10/18/1955         Patient Gender: F Patient Age:   39 years Exam Location:  [facility] Procedure:      VAS US  LOWER EXTREMITY VENOUS REFLUX Referring Phys: ORVIN DARING --------------------------------------------------------------------------------  Indications: Varicosities. Other Indications: Hx of vein treatment, ?sclero in right leg. Performing Technologist: Jerel Croak RVT  Examination Guidelines: A complete evaluation includes B-mode imaging, spectral Doppler, color Doppler, and power Doppler as needed of all accessible portions of each vessel. Bilateral testing is considered an integral part of a complete examination. Limited examinations for reoccurring indications may be performed as noted. The reflux portion of the exam is performed with the patient in reverse Trendelenburg. Significant venous reflux is defined as >500 ms in the superficial venous system, and >1 second in the deep venous system.  Venous Reflux Times +------------------+---------+------+----------+------------+------------------+ RIGHT             Reflux NoReflux  Reflux  Diameter cmsComments                                       Yes     Time                                  +------------------+---------+------+----------+------------+------------------+ GSV at SFJ        no                           .71                        +------------------+---------+------+----------+------------+------------------+ GSV prox thigh                                         prior  ablation/stripping  +------------------+---------+------+----------+------------+------------------+ SSV prox calf     no                           .31                        +------------------+---------+------+----------+------------+------------------+ Accesory Saph               yes   >500 ms      .42                        V-SFJ to mid thigh                                                        +------------------+---------+------+----------+------------+------------------+  +--------------+---------+------+-----------+------------+--------+ LEFT          Reflux NoRefluxReflux TimeDiameter cmsComments                         Yes                                  +--------------+---------+------+-----------+------------+--------+ GSV at SFJ              yes    >500 ms      .66              +--------------+---------+------+-----------+------------+--------+ GSV prox thigh          yes    >500 ms      .54              +--------------+---------+------+-----------+------------+--------+ GSV mid thigh           yes    >500 ms      .47              +--------------+---------+------+-----------+------------+--------+ GSV dist thigh          yes    >500 ms      .48              +--------------+---------+------+-----------+------------+--------+ GSV at knee             yes    >500 ms      .49              +--------------+---------+------+-----------+------------+--------+ GSV prox calf           yes    >500 ms      .35              +--------------+---------+------+-----------+------------+--------+ SSV prox calf no                            .39              +--------------+---------+------+-----------+------------+--------+  Summary: Bilateral: - No evidence of deep vein thrombosis seen in the lower extremities, bilaterally, from the common femoral through the popliteal veins. - No evidence of superficial venous thrombosis in the lower extremities,  bilaterally. - No evidence of deep venous insufficiency seen bilaterally in the lower extremity. - No evidence of superficial venous reflux seen in the short saphenous veins bilaterally.  Right: - GSV not visualized,  patient states prior vein treatment many years ago. Anterior Accessory is seen from mid thigh to SFJ with reflux throughout.  Left: - Venous reflux is noted in the left sapheno-femoral junction. - Venous reflux is noted in the left greater saphenous vein in the thigh. - Venous reflux is noted in the left greater saphenous vein in the calf.  *See table(s) above for measurements and observations. Electronically signed by Cordella Shawl MD on 03/22/2024 at 4:27:34 PM.    Final    MR SHOULDER RIGHT WO CONTRAST Result Date: 03/12/2024 CLINICAL DATA:  Right shoulder pain with painful range of motion for 6 months EXAM: MRI OF THE RIGHT SHOULDER WITHOUT CONTRAST TECHNIQUE: Multiplanar, multisequence MR imaging of the shoulder was performed. No intravenous contrast was administered. COMPARISON:  MR shoulder 11/02/2020 FINDINGS: Rotator cuff: Postsurgical changes from prior rotator cuff repair. Complete full-thickness, full width tear of the supraspinatus tendon measuring 18 mm AP and 30 mm medial-lateral. Moderate infraspinatus tendinosis. Mild subscapularis tendinosis. Teres minor tendon is intact. Muscles: No muscle atrophy or edema. No intramuscular fluid collection or hematoma. Biceps Long Head: Intraarticular portion of the long head of the biceps tendon is not visualized concerning for complete tear versus tenotomy. Acromioclavicular Joint: Mild arthropathy of the acromioclavicular joint. Trace subacromial/subdeltoid bursal fluid. Glenohumeral Joint: High-grade partial-thickness cartilage loss of the glenohumeral joint. No joint effusion. Labrum: Grossly intact, but evaluation is limited by lack of intraarticular fluid/contrast. Bones: No fracture or dislocation. No aggressive osseous lesion. Other: No  fluid collection or hematoma. IMPRESSION: 1. Postsurgical changes from prior rotator cuff repair. Complete full-thickness, full width tear of the supraspinatus tendon measuring 18 mm AP and 30 mm medial-lateral. 2. Moderate infraspinatus tendinosis. 3. Mild subscapularis tendinosis. 4. Intraarticular portion of the long head of the biceps tendon is not visualized concerning for complete tear versus tenotomy. 5. High-grade partial-thickness cartilage loss of the glenohumeral joint. Electronically Signed   By: Julaine Blanch M.D.   On: 03/12/2024 14:59   CT HEAD WO CONTRAST ( ) Result Date: 03/08/2024 EXAM: CT HEAD WITHOUT CONTRAST 03/02/2024 11:16:56 AM TECHNIQUE: CT of the head was performed without the administration of intravenous contrast. Automated exposure control, iterative reconstruction, and/or weight based adjustment of the mA/kV was utilized to reduce the radiation dose to as low as reasonably achievable. COMPARISON: Brain MRI 06/07/2021. CLINICAL HISTORY: 68 year old female Vision disorder H53.9 (ICD-10-CM)History of migraine Z86.69 (ICD-10-CM)Chronic tension-type headache, intractable G44.221 (ICD-10-CM). FINDINGS: Motion artifact at the skull base. BRAIN AND VENTRICLES: Brain volume stable since 2023, within normal limits for age. No acute hemorrhage. No evidence of acute infarct. No hydrocephalus. No extra-axial collection. No mass effect or midline shift. Gray white differentiation within normal limits for age. No suspicious intracranial vascular hyperdensity. ORBITS: Orbit soft tissue motion artifact. Postoperative changes are evident at the globes. Otherwise grossly negative visible orbit soft tissues. SINUSES: Visible paranasal sinuses, middle ears and mastoids are grossly clear. SOFT TISSUES AND SKULL: Motion artifact at the skull base. No acute soft tissue abnormality. No skull fracture. IMPRESSION: 1. Normal for age non-contrast head CT when allowing for mild motion artifact. Electronically  signed by: Helayne Hurst MD 03/08/2024 11:45 AM EST RP Workstation: [id number]   CT CARDIAC SCORING (SELF PAY ONLY) Addendum Date: 03/06/2024 ADDENDUM REPORT: 03/06/2024 21:51 EXAM: OVER-READ INTERPRETATION  CT CHEST The following report is an over-read performed by radiologist Dr. Suzen Dials of [facility] on 03/06/2024. This over-read does not include interpretation of cardiac or coronary anatomy or pathology. The coronary calcium  score/coronary  CTA interpretation by the cardiologist is attached. COMPARISON:  None. FINDINGS: Cardiovascular: There are no significant extracardiac vascular findings. Mediastinum/Nodes: There are no enlarged lymph nodes within the visualized mediastinum. Lungs/Pleura: There is no pleural effusion. A 3 mm pulmonary nodule is seen within the posterior aspect of the right lower lobe (axial CT image 12, CT series 11). Upper abdomen: 0.9 cm and 1.8 cm foci of parenchymal low attenuation are seen within the right and left lobes of the liver. Musculoskeletal/Chest wall: No chest wall mass or suspicious osseous findings within the visualized chest. IMPRESSION: 3 mm right lower lobe noncalcified lung nodule. No follow-up needed if patient is low-risk.This recommendation follows the consensus statement: Guidelines for Management of Incidental Pulmonary Nodules Detected on CT Images: From the Fleischner Society 2017; Radiology 2017; 284:228-243. Electronically Signed   By: Suzen Dials M.D.   On: 03/06/2024 21:51   Result Date: 03/06/2024 : CLINICAL DATA:  Risk stratification EXAM: Coronary Calcium  Score TECHNIQUE: The patient was scanned on a Siemens Somatom scanner. Axial non-contrast 3 mm slices were carried out through the heart. The data set was analyzed on a dedicated work station and scored using the Agatston method. FINDINGS: Non-cardiac: See separate report from [facility]. Ascending Aorta: Normal size Pericardium: Normal Coronary arteries: Normal  origin of left and right coronary arteries. Distribution of arterial calcifications if present, as noted below: LM 0 LAD 12 LCx 29 RCA 0 Total 41 IMPRESSION AND RECOMMENDATION: 1. Coronary calcium  score of 41. This was 61st percentile for age and sex matched controls (MESA). 2. CAC 1-99 in LAD, LCx. CAC-DRS A1/N2. 3. Recommend statin if no contraindications. 4. Continue heart healthy lifestyle and risk factor modification. Electronically Signed: By: Dearl Leaven On: 03/02/2024 19:49     Assessment/Plan 1. Varicose veins with inflammation (Primary) Recommend  I have reviewed my previous  discussion with the patient regarding  varicose veins and why they cause symptoms. Patient will continue  wearing graduated compression stockings class 1 on a daily basis, beginning first thing in the morning and removing them in the evening.  The patient is CEAP C3sEpAsPr.  The patient has been wearing compression for more than 12 weeks with no or little benefit.  The patient has been exercising daily for more than 12 weeks. The patient has been elevating and taking OTC pain medications for more than 12 weeks.  None of these have have eliminated the pain related to the varicose veins and venous reflux or the discomfort regarding venous congestion.    In addition, behavioral modification including elevation during the day was again discussed and this will continue.  The patient has utilized over the counter pain medications and has been exercising.  However, at this time conservative therapy has not alleviated the patient's symptoms of leg pain and swelling  Recommend: laser ablation of the left great saphenous vein to eliminate the symptoms of pain and swelling of the lower extremities caused by the severe superficial venous reflux disease.   2. Syncope, unspecified syncope type Recommend:  The patient is describing newfound syncope.  I will order a duplex ultrasound of the carotid arteries to evaluate for  possible vascular etiology.  Continue antiplatelet therapy as prescribed Continue management of CAD, HTN and Hyperlipidemia Healthy heart diet,  encouraged exercise at least 4 times per week  - VAS US  CAROTID; Future  3. Pure hypercholesterolemia Continue statin as ordered and reviewed, no changes at this time  4. Lumbar radiculopathy Continue medications to treat the patient's degenerative  disease as already ordered, these medications have been reviewed and there are no changes at this time.  Continued activity and therapy was stressed.    Cordella Shawl, MD  03/23/2024 12:46 PM      [1]  Current Meds  Medication Sig   alendronate (FOSAMAX) 70 MG tablet Take 70 mg by mouth once a week. Take with a full glass of water  on an empty stomach.   buPROPion  (WELLBUTRIN  XL) 300 MG 24 hr tablet Take 300 mg by mouth daily.    Calcium  Carb-Cholecalciferol  (CALCIUM  + D3 PO) Take 1 tablet by mouth in the morning and at bedtime.   cyanocobalamin (VITAMIN B12) 1000 MCG tablet Take 1,000 mcg by mouth daily.   DULoxetine  (CYMBALTA ) 30 MG capsule Take 30 mg by mouth daily.   levothyroxine  (SYNTHROID , LEVOTHROID) 50 MCG tablet Take 50 mcg by mouth daily before breakfast.   Magnesium  300 MG CAPS Take by mouth daily.   omeprazole (PRILOSEC) 10 MG capsule Take 10 mg by mouth daily.   phentermine 37.5 MG capsule Take 37.5 mg by mouth every morning.   simvastatin  (ZOCOR ) 20 MG tablet Take 20 mg by mouth at bedtime.    traZODone  (DESYREL ) 50 MG tablet Take 50-100 mg by mouth at bedtime.  [2]  Social History Tobacco Use   Smoking status: Never   Smokeless tobacco: Never  Vaping Use   Vaping status: Never Used  Substance Use Topics   Alcohol use: Not Currently    Comment: occas   Drug use: No  [3]  Allergies Allergen Reactions   Morphine Nausea Only   Other Anaphylaxis    IVP contrast dye   Penicillin G Anaphylaxis   Iodinated Contrast Media Swelling   Codeine Nausea Only    Shellfish Allergy Swelling

## 2024-03-25 ENCOUNTER — Encounter (INDEPENDENT_AMBULATORY_CARE_PROVIDER_SITE_OTHER): Payer: Self-pay | Admitting: Vascular Surgery

## 2024-03-25 ENCOUNTER — Ambulatory Visit (INDEPENDENT_AMBULATORY_CARE_PROVIDER_SITE_OTHER): Admitting: Vascular Surgery

## 2024-03-25 VITALS — BP 139/82 | HR 84 | Resp 17 | Ht 62.0 in | Wt 151.2 lb

## 2024-03-25 DIAGNOSIS — M5416 Radiculopathy, lumbar region: Secondary | ICD-10-CM | POA: Diagnosis not present

## 2024-03-25 DIAGNOSIS — I8310 Varicose veins of unspecified lower extremity with inflammation: Secondary | ICD-10-CM | POA: Diagnosis not present

## 2024-03-25 DIAGNOSIS — R55 Syncope and collapse: Secondary | ICD-10-CM | POA: Diagnosis not present

## 2024-03-25 DIAGNOSIS — K219 Gastro-esophageal reflux disease without esophagitis: Secondary | ICD-10-CM

## 2024-03-25 NOTE — Progress Notes (Signed)
 MRN : [id number]  Cindy Carpenter is a 68 y.o. (Oct 19, 1955) female who presents with chief complaint of check carotid arteries.  History of Present Illness:   The patient is seen for evaluation of a syncopal episodes. The patient describes it as a light headedness and denies the room spinning.  It seems to occur mostly when she bends over for example to tie her shoe.  It lasted on the order of minutes and resolved completely.  There was no loss of consciousness.  There have been two or three prior episodes over the past.  There is no recent history of TIA symptoms or focal motor deficits. There is no prior documented CVA.  The patient was not taking enteric-coated aspirin  81 mg daily at the time.  There is no history of migraine headaches or prior diagnosis of ocular migraine. There is no history of seizures.  No recent shortening of the patient's walking distance or new symptoms consistent with claudication.  No history of rest pain symptoms. No new ulcers or wounds of the lower extremities have occurred.  There is no history of DVT, PE or superficial thrombophlebitis. No recent episodes of angina or shortness of breath documented.  Carotid duplex is normal bilaterally with normal antegrade vertebral flow.  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    Family history of adverse reaction to anesthesia    Mother - PONV   GERD (gastroesophageal reflux disease)    Headache    migraine - couple times per year   Heart palpitations    Hyperlipemia    Insomnia    Osteopenia    PONV (postoperative nausea and vomiting)    scopolamine  has worked well in the past   Vaginal atrophy     Past Surgical History:  Procedure Laterality Date   ADENOIDECTOMY     APPENDECTOMY     ARTHROSCOPIC REPAIR ACL     BROW LIFT Bilateral 07/22/2017   Procedure: BLEPHAROPLASTY UPPER EYELID  WITH EXCESS SKIN;  Surgeon: Ashley Greig HERO, MD;  Location: [facility];  Service: Ophthalmology;  Laterality: Bilateral;   CATARACT EXTRACTION W/PHACO Right 08/12/2023   Procedure: PHACOEMULSIFICATION, CATARACT, WITH IOL INSERTION 3.93 00:29.8;  Surgeon: Jaye Fallow, MD;  Location: [facility];  Service: Ophthalmology;  Laterality: Right;   CATARACT EXTRACTION W/PHACO Left 08/26/2023   Procedure: PHACOEMULSIFICATION, CATARACT, WITH IOL INSERTION 9.59 00:48.1;  Surgeon: Jaye Fallow, MD;  Location: [facility];  Service: Ophthalmology;  Laterality: Left;   COLONOSCOPY WITH PROPOFOL  N/A 09/17/2016   Procedure: COLONOSCOPY WITH PROPOFOL ;  Surgeon: Jinny Carmine, MD;  Location: [facility];  Service: Endoscopy;  Laterality: N/A;   COLONOSCOPY WITH PROPOFOL  N/A 08/08/2022   Procedure: COLONOSCOPY WITH PROPOFOL  WITH POLYPECTOMY;  Surgeon: Jinny Carmine, MD;  Location: [facility];  Service: Endoscopy;  Laterality: N/A;   FOOT SURGERY     KNEE ARTHROPLASTY Left 08/30/2020   Procedure: COMPUTER ASSISTED TOTAL KNEE ARTHROPLASTY;  Surgeon: Mardee Lynwood SQUIBB, MD;  Location: [facility];  Service: Orthopedics;  Laterality: Left;   REDUCTION MAMMAPLASTY Bilateral 03/2018   ROTATOR CUFF REPAIR Left    SHOULDER ARTHROSCOPY WITH SUBACROMIAL DECOMPRESSION AND OPEN ROTATOR C Right 12/06/2019   Procedure: Right arthroscopic rotator cuff repair  Right  arthroscopic extensive debridement of shoulder (glenohumeral and subacromial spaces)  Right arthroscopic subacromial decompression;  Surgeon: Tobie Priest, MD;  Location: [facility];  Service: Orthopedics;  Laterality: Right;   TONSILLECTOMY      Social History Social History[2]  Family History Family History  Problem Relation Age of Onset   Heart disease Mother    Diabetes Mother    Heart disease Father    Diabetes Father    Hypertension Father    Diabetes Brother    Ovarian cancer Maternal Grandmother    Breast cancer Neg Hx     Colon cancer Neg Hx     Allergies[3]   REVIEW OF SYSTEMS (Negative unless checked)  Constitutional: [] Weight loss  [] Fever  [] Chills Cardiac: [] Chest pain   [] Chest pressure   [] Palpitations   [] Shortness of breath when laying flat   [] Shortness of breath with exertion. Vascular:  [x] Pain in legs with walking   [] Pain in legs at rest  [] History of DVT   [] Phlebitis   [] Swelling in legs   [] Varicose veins   [] Non-healing ulcers Pulmonary:   [] Uses home oxygen   [] Productive cough   [] Hemoptysis   [] Wheeze  [] COPD   [] Asthma Neurologic:  [] Dizziness   [] Seizures   [] History of stroke   [] History of TIA  [] Aphasia   [] Vissual changes   [] Weakness or numbness in arm   [] Weakness or numbness in leg Musculoskeletal:   [] Joint swelling   [] Joint pain   [] Low back pain Hematologic:  [] Easy bruising  [] Easy bleeding   [] Hypercoagulable state   [] Anemic Gastrointestinal:  [] Diarrhea   [] Vomiting  [] Gastroesophageal reflux/heartburn   [] Difficulty swallowing. Genitourinary:  [] Chronic kidney disease   [] Difficult urination  [] Frequent urination   [] Blood in urine Skin:  [] Rashes   [] Ulcers  Psychological:  [] History of anxiety   []  History of major depression.  Physical Examination  Vitals:   03/25/24 0829  BP: 139/82  Pulse: 84  Resp: 17  Weight: 151 lb 3.2 oz (68.6 kg)  Height: 5' 2 (1.575 m)   Body mass index is 27.65 kg/m. Gen: WD/WN, NAD Head: [place]/AT, No temporalis wasting.  Ear/Nose/Throat: Hearing grossly intact, nares w/o erythema or drainage Eyes: PER, EOMI, sclera nonicteric.  Neck: Supple, no masses.  No bruit or JVD.  Pulmonary:  Good air movement, no audible wheezing, no use of accessory muscles.  Cardiac: RRR, normal S1, S2, no Murmurs. Vascular:  No carotid bruit noted Vessel Right Left  Radial Palpable Palpable  Gastrointestinal: soft, non-distended. No guarding/no peritoneal signs.  Musculoskeletal: M/S 5/5 throughout.  No visible deformity.  Neurologic: CN 2-12  intact. Pain and light touch intact in extremities.  Symmetrical.  Speech is fluent. Motor exam as listed above. Psychiatric: Judgment intact, Mood & affect appropriate for pt's clinical situation. Dermatologic: No rashes or ulcers noted.  No changes consistent with cellulitis.   CBC Lab Results  Component Value Date   WBC 5.0 08/23/2020   HGB 12.9 08/23/2020   HCT 39.5 08/23/2020   MCV 91.4 08/23/2020   PLT 228 08/23/2020    BMET    Component Value Date/Time   NA 142 08/23/2020 0924   K 3.6 08/23/2020 0924   CL 107 08/23/2020 0924   CO2 27 08/23/2020 0924   GLUCOSE 105 (H) 08/23/2020 0924   BUN 14 08/23/2020 0924   CREATININE 0.60 01/02/2021 1001   CALCIUM  9.1 08/23/2020 0924   GFRNONAA >60 08/23/2020 0924   CrCl cannot be calculated (Patient's most recent lab  result is older than the maximum 21 days allowed.).  COAG Lab Results  Component Value Date   INR 0.9 08/23/2020    Radiology VAS US  CAROTID Result Date: 03/24/2024 Carotid Arterial Duplex Study Patient Name:  MICAYLA BRATHWAITE  Date of Exam:   03/23/2024 Medical Rec #: [id number]         Accession #:    [id number] Date of Birth: 03/07/1956         Patient Gender: F Patient Age:   37 years Exam Location:  [facility] Procedure:      VAS US  CAROTID Referring Phys: CORDELLA SHAWL --------------------------------------------------------------------------------  Indications: Syncope. Performing Technologist: Jerel Croak RVT  Examination Guidelines: A complete evaluation includes B-mode imaging, spectral Doppler, color Doppler, and power Doppler as needed of all accessible portions of each vessel. Bilateral testing is considered an integral part of a complete examination. Limited examinations for reoccurring indications may be performed as noted.  Right Carotid Findings: +----------+--------+--------+--------+------------------+--------+           PSV cm/sEDV cm/sStenosisPlaque DescriptionComments  +----------+--------+--------+--------+------------------+--------+ CCA Prox  62      11                                         +----------+--------+--------+--------+------------------+--------+ CCA Mid   74      23                                         +----------+--------+--------+--------+------------------+--------+ CCA Distal82      31                                         +----------+--------+--------+--------+------------------+--------+ ICA Prox  40      10                                         +----------+--------+--------+--------+------------------+--------+ ICA Mid   56      23                                         +----------+--------+--------+--------+------------------+--------+ ICA Distal55      22                                         +----------+--------+--------+--------+------------------+--------+ ECA       70      11                                         +----------+--------+--------+--------+------------------+--------+ +----------+--------+-------+----------------+-------------------+           PSV cm/sEDV cmsDescribe        Arm Pressure (mmHG) +----------+--------+-------+----------------+-------------------+ [id number]             Multiphasic, WNL                    +----------+--------+-------+----------------+-------------------+ +---------+--------+--+--------+--+---------+  VertebralPSV cm/s37EDV cm/s15Antegrade +---------+--------+--+--------+--+---------+  Left Carotid Findings: +----------+--------+--------+--------+------------------+--------+           PSV cm/sEDV cm/sStenosisPlaque DescriptionComments +----------+--------+--------+--------+------------------+--------+ CCA Prox  70      22                                         +----------+--------+--------+--------+------------------+--------+ CCA Mid   67      22                                          +----------+--------+--------+--------+------------------+--------+ CCA Distal72      25                                         +----------+--------+--------+--------+------------------+--------+ ICA Prox  69      15                                         +----------+--------+--------+--------+------------------+--------+ ICA Mid   61      14                                         +----------+--------+--------+--------+------------------+--------+ ICA Distal63      14                                         +----------+--------+--------+--------+------------------+--------+ ECA       55      13                                         +----------+--------+--------+--------+------------------+--------+ +----------+--------+--------+----------------+-------------------+           PSV cm/sEDV cm/sDescribe        Arm Pressure (mmHG) +----------+--------+--------+----------------+-------------------+ Dlarojcpjw22              Multiphasic, WNL                    +----------+--------+--------+----------------+-------------------+ +---------+--------+--+--------+--+---------+ VertebralPSV cm/s41EDV cm/s13Antegrade +---------+--------+--+--------+--+---------+   Summary: Right Carotid: There was no evidence of thrombus, dissection, atherosclerotic                plaque or stenosis in the cervical carotid system. Left Carotid: There was no evidence of thrombus, dissection, atherosclerotic               plaque or stenosis in the cervical carotid system. Vertebrals:  Bilateral vertebral arteries demonstrate antegrade flow. Subclavians: Normal flow hemodynamics were seen in bilateral subclavian              arteries. *See table(s) above for measurements and observations.  Electronically signed by Cordella Shawl MD on 03/24/2024 at 8:57:04 AM.    Final    VAS US  LOWER EXTREMITY VENOUS REFLUX Result Date: 03/22/2024  Lower Venous Reflux Study Patient Name:  KITRINA MAURIN  Date of Exam:  03/22/2024 Medical Rec #: [id number]         Accession #:    [id number] Date of Birth: Feb 28, 1956         Patient Gender: F Patient Age:   76 years Exam Location:  [facility] Procedure:      VAS US  LOWER EXTREMITY VENOUS REFLUX Referring Phys: ORVIN DARING --------------------------------------------------------------------------------  Indications: Varicosities. Other Indications: Hx of vein treatment, ?sclero in right leg. Performing Technologist: Jerel Croak RVT  Examination Guidelines: A complete evaluation includes B-mode imaging, spectral Doppler, color Doppler, and power Doppler as needed of all accessible portions of each vessel. Bilateral testing is considered an integral part of a complete examination. Limited examinations for reoccurring indications may be performed as noted. The reflux portion of the exam is performed with the patient in reverse Trendelenburg. Significant venous reflux is defined as >500 ms in the superficial venous system, and >1 second in the deep venous system.  Venous Reflux Times +------------------+---------+------+----------+------------+------------------+ RIGHT             Reflux NoReflux  Reflux  Diameter cmsComments                                       Yes     Time                                  +------------------+---------+------+----------+------------+------------------+ GSV at SFJ        no                           .71                        +------------------+---------+------+----------+------------+------------------+ GSV prox thigh                                         prior                                                                     ablation/stripping +------------------+---------+------+----------+------------+------------------+ SSV prox calf     no                           .31                         +------------------+---------+------+----------+------------+------------------+ Accesory Saph               yes   >500 ms      .42                        V-SFJ to mid thigh                                                        +------------------+---------+------+----------+------------+------------------+  +--------------+---------+------+-----------+------------+--------+  LEFT          Reflux NoRefluxReflux TimeDiameter cmsComments                         Yes                                  +--------------+---------+------+-----------+------------+--------+ GSV at SFJ              yes    >500 ms      .66              +--------------+---------+------+-----------+------------+--------+ GSV prox thigh          yes    >500 ms      .54              +--------------+---------+------+-----------+------------+--------+ GSV mid thigh           yes    >500 ms      .47              +--------------+---------+------+-----------+------------+--------+ GSV dist thigh          yes    >500 ms      .48              +--------------+---------+------+-----------+------------+--------+ GSV at knee             yes    >500 ms      .49              +--------------+---------+------+-----------+------------+--------+ GSV prox calf           yes    >500 ms      .35              +--------------+---------+------+-----------+------------+--------+ SSV prox calf no                            .39              +--------------+---------+------+-----------+------------+--------+  Summary: Bilateral: - No evidence of deep vein thrombosis seen in the lower extremities, bilaterally, from the common femoral through the popliteal veins. - No evidence of superficial venous thrombosis in the lower extremities, bilaterally. - No evidence of deep venous insufficiency seen bilaterally in the lower extremity. - No evidence of superficial venous reflux seen in the short saphenous  veins bilaterally.  Right: - GSV not visualized, patient states prior vein treatment many years ago. Anterior Accessory is seen from mid thigh to SFJ with reflux throughout.  Left: - Venous reflux is noted in the left sapheno-femoral junction. - Venous reflux is noted in the left greater saphenous vein in the thigh. - Venous reflux is noted in the left greater saphenous vein in the calf.  *See table(s) above for measurements and observations. Electronically signed by Cordella Shawl MD on 03/22/2024 at 4:27:34 PM.    Final    MR SHOULDER RIGHT WO CONTRAST Result Date: 03/12/2024 CLINICAL DATA:  Right shoulder pain with painful range of motion for 6 months EXAM: MRI OF THE RIGHT SHOULDER WITHOUT CONTRAST TECHNIQUE: Multiplanar, multisequence MR imaging of the shoulder was performed. No intravenous contrast was administered. COMPARISON:  MR shoulder 11/02/2020 FINDINGS: Rotator cuff: Postsurgical changes from prior rotator cuff repair. Complete full-thickness, full width tear of the supraspinatus tendon measuring 18 mm AP and 30 mm medial-lateral. Moderate infraspinatus tendinosis. Mild subscapularis tendinosis. Teres minor tendon is intact.  Muscles: No muscle atrophy or edema. No intramuscular fluid collection or hematoma. Biceps Long Head: Intraarticular portion of the long head of the biceps tendon is not visualized concerning for complete tear versus tenotomy. Acromioclavicular Joint: Mild arthropathy of the acromioclavicular joint. Trace subacromial/subdeltoid bursal fluid. Glenohumeral Joint: High-grade partial-thickness cartilage loss of the glenohumeral joint. No joint effusion. Labrum: Grossly intact, but evaluation is limited by lack of intraarticular fluid/contrast. Bones: No fracture or dislocation. No aggressive osseous lesion. Other: No fluid collection or hematoma. IMPRESSION: 1. Postsurgical changes from prior rotator cuff repair. Complete full-thickness, full width tear of the supraspinatus tendon  measuring 18 mm AP and 30 mm medial-lateral. 2. Moderate infraspinatus tendinosis. 3. Mild subscapularis tendinosis. 4. Intraarticular portion of the long head of the biceps tendon is not visualized concerning for complete tear versus tenotomy. 5. High-grade partial-thickness cartilage loss of the glenohumeral joint. Electronically Signed   By: Julaine Blanch M.D.   On: 03/12/2024 14:59   CT HEAD WO CONTRAST ( ) Result Date: 03/08/2024 EXAM: CT HEAD WITHOUT CONTRAST 03/02/2024 11:16:56 AM TECHNIQUE: CT of the head was performed without the administration of intravenous contrast. Automated exposure control, iterative reconstruction, and/or weight based adjustment of the mA/kV was utilized to reduce the radiation dose to as low as reasonably achievable. COMPARISON: Brain MRI 06/07/2021. CLINICAL HISTORY: 68 year old female Vision disorder H53.9 (ICD-10-CM)History of migraine Z86.69 (ICD-10-CM)Chronic tension-type headache, intractable G44.221 (ICD-10-CM). FINDINGS: Motion artifact at the skull base. BRAIN AND VENTRICLES: Brain volume stable since 2023, within normal limits for age. No acute hemorrhage. No evidence of acute infarct. No hydrocephalus. No extra-axial collection. No mass effect or midline shift. Gray white differentiation within normal limits for age. No suspicious intracranial vascular hyperdensity. ORBITS: Orbit soft tissue motion artifact. Postoperative changes are evident at the globes. Otherwise grossly negative visible orbit soft tissues. SINUSES: Visible paranasal sinuses, middle ears and mastoids are grossly clear. SOFT TISSUES AND SKULL: Motion artifact at the skull base. No acute soft tissue abnormality. No skull fracture. IMPRESSION: 1. Normal for age non-contrast head CT when allowing for mild motion artifact. Electronically signed by: Helayne Hurst MD 03/08/2024 11:45 AM EST RP Workstation: [id number]   CT CARDIAC SCORING (SELF PAY ONLY) Addendum Date: 03/06/2024 ADDENDUM REPORT:  03/06/2024 21:51 EXAM: OVER-READ INTERPRETATION  CT CHEST The following report is an over-read performed by radiologist Dr. Suzen Dials of [facility] on 03/06/2024. This over-read does not include interpretation of cardiac or coronary anatomy or pathology. The coronary calcium  score/coronary CTA interpretation by the cardiologist is attached. COMPARISON:  None. FINDINGS: Cardiovascular: There are no significant extracardiac vascular findings. Mediastinum/Nodes: There are no enlarged lymph nodes within the visualized mediastinum. Lungs/Pleura: There is no pleural effusion. A 3 mm pulmonary nodule is seen within the posterior aspect of the right lower lobe (axial CT image 12, CT series 11). Upper abdomen: 0.9 cm and 1.8 cm foci of parenchymal low attenuation are seen within the right and left lobes of the liver. Musculoskeletal/Chest wall: No chest wall mass or suspicious osseous findings within the visualized chest. IMPRESSION: 3 mm right lower lobe noncalcified lung nodule. No follow-up needed if patient is low-risk.This recommendation follows the consensus statement: Guidelines for Management of Incidental Pulmonary Nodules Detected on CT Images: From the Fleischner Society 2017; Radiology 2017; 284:228-243. Electronically Signed   By: Suzen Dials M.D.   On: 03/06/2024 21:51   Result Date: 03/06/2024 : CLINICAL DATA:  Risk stratification EXAM: Coronary Calcium  Score TECHNIQUE: The patient was scanned on a Siemens Somatom  scanner. Axial non-contrast 3 mm slices were carried out through the heart. The data set was analyzed on a dedicated work station and scored using the Agatston method. FINDINGS: Non-cardiac: See separate report from [facility]. Ascending Aorta: Normal size Pericardium: Normal Coronary arteries: Normal origin of left and right coronary arteries. Distribution of arterial calcifications if present, as noted below: LM 0 LAD 12 LCx 29 RCA 0 Total 41 IMPRESSION AND  RECOMMENDATION: 1. Coronary calcium  score of 41. This was 61st percentile for age and sex matched controls (MESA). 2. CAC 1-99 in LAD, LCx. CAC-DRS A1/N2. 3. Recommend statin if no contraindications. 4. Continue heart healthy lifestyle and risk factor modification. Electronically Signed: By: Dearl Leaven On: 03/02/2024 19:49     Assessment/Plan: 1. Syncope, unspecified syncope type (Primary) Recommend: Patient continues to experience syncope.  Given the patient's carotid duplex is normal with no evidence of plaque formation and bilateral normal vertebral flow in the antegrade direction no further vascular workup is indicated.  Continue antiplatelet therapy as prescribed Healthy heart diet,  encouraged exercise at least 4 times per week  I suspect the most likely cause of her syncope is inner ear dysfunction.  She has seen Dr. Carolee Hunter in the past and I will refer her back to him for further evaluation.  - Ambulatory referral to ENT  2. Lumbar radiculopathy Continue medications to treat the patient's degenerative disease as already ordered, these medications have been reviewed and there are no changes at this time.  Continued activity and therapy was stressed.  3. Varicose veins with inflammation Recommend   I have reviewed my previous  discussion with the patient regarding  varicose veins and why they cause symptoms. Patient will continue  wearing graduated compression stockings class 1 on a daily basis, beginning first thing in the morning and removing them in the evening.  The patient is CEAP [id number].   The patient has been wearing compression for more than 12 weeks with no or little benefit.  The patient has been exercising daily for more than 12 weeks. The patient has been elevating and taking OTC pain medications for more than 12 weeks.  None of these have have eliminated the pain related to the varicose veins and venous reflux or the discomfort regarding venous  congestion.     In addition, behavioral modification including elevation during the day was again discussed and this will continue.   The patient has utilized over the counter pain medications and has been exercising.   However, at this time conservative therapy has not alleviated the patient's symptoms of leg pain and swelling   Recommend: laser ablation of the left great saphenous vein to eliminate the symptoms of pain and swelling of the lower extremities caused by the severe superficial venous reflux disease.   4. Gastroesophageal reflux disease without esophagitis Continue PPI as already ordered, this medication has been reviewed and there are no changes at this time.  Avoidence of caffeine  and alcohol  Moderate elevation of the head of the bed      Cordella Shawl, MD  03/25/2024 8:58 AM      [1]  Current Meds  Medication Sig   alendronate (FOSAMAX) 70 MG tablet Take 70 mg by mouth once a week. Take with a full glass of water  on an empty stomach.   buPROPion  (WELLBUTRIN  XL) 300 MG 24 hr tablet Take 300 mg by mouth daily.    Calcium  Carb-Cholecalciferol  (CALCIUM  + D3 PO) Take 1 tablet by mouth in  the morning and at bedtime.   cyanocobalamin (VITAMIN B12) 1000 MCG tablet Take 1,000 mcg by mouth daily.   DULoxetine  (CYMBALTA ) 30 MG capsule Take 30 mg by mouth daily.   levothyroxine  (SYNTHROID , LEVOTHROID) 50 MCG tablet Take 50 mcg by mouth daily before breakfast.   omeprazole (PRILOSEC) 10 MG capsule Take 10 mg by mouth daily.   phentermine 37.5 MG capsule Take 37.5 mg by mouth every morning.   simvastatin  (ZOCOR ) 20 MG tablet Take 20 mg by mouth at bedtime.    traZODone  (DESYREL ) 50 MG tablet Take 50-100 mg by mouth at bedtime.  [2]  Social History Tobacco Use   Smoking status: Never   Smokeless tobacco: Never  Vaping Use   Vaping status: Never Used  Substance Use Topics   Alcohol use: Not Currently    Comment: occas   Drug use: No  [3]  Allergies Allergen  Reactions   Morphine Nausea Only   Other Anaphylaxis    IVP contrast dye   Penicillin G Anaphylaxis   Iodinated Contrast Media Swelling   Codeine Nausea Only   Shellfish Allergy Swelling
